# Patient Record
Sex: Male | Born: 1950
Health system: Southern US, Community
[De-identification: ages and names within clinical notes are randomized; demographics above are authoritative.]

## PROBLEM LIST (undated history)

## (undated) DIAGNOSIS — E119 Type 2 diabetes mellitus without complications: Secondary | ICD-10-CM

## (undated) DIAGNOSIS — M1990 Unspecified osteoarthritis, unspecified site: Secondary | ICD-10-CM

## (undated) DIAGNOSIS — N200 Calculus of kidney: Secondary | ICD-10-CM

## (undated) HISTORY — PX: ROTATOR CUFF REPAIR: SHX139

## (undated) HISTORY — DX: Unspecified osteoarthritis, unspecified site: M19.90

## (undated) HISTORY — DX: Calculus of kidney: N20.0

## (undated) HISTORY — PX: JOINT REPLACEMENT: SHX530

## (undated) HISTORY — DX: Type 2 diabetes mellitus without complications: E11.9

---

## 1997-12-20 ENCOUNTER — Emergency Department (HOSPITAL_COMMUNITY): Admission: EM | Admit: 1997-12-20 | Discharge: 1997-12-20 | Payer: Self-pay | Admitting: Emergency Medicine

## 1999-08-16 ENCOUNTER — Encounter: Payer: Self-pay | Admitting: Orthopedic Surgery

## 1999-08-16 ENCOUNTER — Ambulatory Visit (HOSPITAL_COMMUNITY): Admission: RE | Admit: 1999-08-16 | Discharge: 1999-08-16 | Payer: Self-pay | Admitting: Orthopedic Surgery

## 1999-11-04 ENCOUNTER — Emergency Department (HOSPITAL_COMMUNITY): Admission: EM | Admit: 1999-11-04 | Discharge: 1999-11-04 | Payer: Self-pay | Admitting: Emergency Medicine

## 2001-03-23 ENCOUNTER — Ambulatory Visit (HOSPITAL_COMMUNITY): Admission: RE | Admit: 2001-03-23 | Discharge: 2001-03-23 | Payer: Self-pay | Admitting: Gastroenterology

## 2001-05-09 ENCOUNTER — Encounter: Payer: Self-pay | Admitting: *Deleted

## 2001-05-10 ENCOUNTER — Observation Stay (HOSPITAL_COMMUNITY): Admission: EM | Admit: 2001-05-10 | Discharge: 2001-05-10 | Payer: Self-pay | Admitting: Emergency Medicine

## 2008-03-10 ENCOUNTER — Inpatient Hospital Stay (HOSPITAL_COMMUNITY): Admission: RE | Admit: 2008-03-10 | Discharge: 2008-03-15 | Payer: Self-pay | Admitting: Orthopedic Surgery

## 2010-10-26 NOTE — Op Note (Signed)
NAMEENDY, EASTERLY                ACCOUNT NO.:  [id number]   MEDICAL RECORD NO.:  [id number]          PATIENT TYPE:  INP   LOCATION:  0008                         FACILITY:  [facility]   PHYSICIAN:  Ollen Gross, M.D.    DATE OF BIRTH:  1950/10/23   DATE OF PROCEDURE:  03/10/2008  DATE OF DISCHARGE:                               OPERATIVE REPORT   PREOPERATIVE DIAGNOSIS:  Osteoarthritis bilateral knees.   POSTOPERATIVE DIAGNOSIS:  Osteoarthritis bilateral knees.   PROCEDURE:  Bilateral total knee arthroplasty.   SURGEON:  Ollen Gross, M.D.   ASSISTANT:  Alexzandrew L. Perkins, P.A.C.   ANESTHESIA:  General with postoperative epidural.   ESTIMATED BLOOD LOSS:  Minimal.   DRAIN:  Autovac x2.   TOURNIQUET TIME:  47 minutes at  300 mmHg on both sides.   COMPLICATIONS:  None.   CONDITION:  Stable to recovery room.   BRIEF CLINICAL NOTE:  Nathan Byrd is a 60 year old male with severe end-stage  arthritis of both knees, with progressively worsening pain, deformity  and dysfunction.  He has failed nonoperative management and we discussed  options of total knee arthroplasty.  Given the significant deformity and  dysfunction of both knees, and given his young age and good health, it  was decided to perform bilateral total knee arthroplasty as opposed to  staging them.  He presents now for bilateral total knee arthroplasty.   PROCEDURE IN DETAIL:  After the successful administration of an epidural  anesthetic and then general anesthetic, a tourniquet was placed high on  both thighs and both lower extremities were prepped and draped in usual  sterile fashion.  The left side hurt more preoperatively, so we did that  one first.  The left lower extremity was wrapped in Esmarch, the knee  flexed, tourniquet inflated to 300 mmHg.  A midline incision was made  with a 10 blade through subcutaneous tissue to the level of the extensor  mechanism.  A fresh blade was used to make a medial  parapatellar  arthrotomy.  Soft tissue of the proximal and medial tibia  subperiosteally elevated at the joint line with the knife and into the  semi-membranosus bursa with a Cobb elevator.  Soft tissue laterally was  elevated, with attention being paid to avoid the patellar tendon on the  tibial tubercle.  The patella was subluxed laterally, knee flexed 90  degrees and ACL and PCL removed.  A drill was used to create a starting  hole in the distal femur and the canal was thoroughly irrigated.  A 5-  degree left valgus alignment guide was placed, referencing off the  posterior condyles.  Rotation was marked and the block pinned to remove  11 mm from the distal femur.  It took an 11 because of preoperative  flexion contracture.  Distal femoral resection was made with an  oscillating saw.  A sizing block was placed and size 4 was the most  appropriate.  Rotation was marked at the epicondylar axis.  A size 4  cutting block was placed, and the anterior, posterior and chamfer cuts  were made.  The tibia was subluxed forward and the menisci removed.  Extramedullary  tibial alignment guide was placed, referencing proximally at the medial  aspect of the tibial tubercle and distally along the second metatarsal  axis and tibial crest.  The block was pinned to remove 2 mm off the more  deficient medial side.  Tibial resection was made with an oscillating  saw.  A size 4 was the most appropriate tibial component, and the  proximal tibia was prepared with the modular drill and keel punched for  a size 4.  The femoral preparation completed with the intercondylar cut.   The size 4 mobile bearing tibial trial, the size 4 posterior stabilized  femoral trial, and the 10-mm posterior stabilized rotating platform  insert trial were placed.  But, the tendon had a tiny bit of  hyperextension, so I went with the 12.5 -- which allowed for full  extension, with excellent varus, valgus, anterior and posterior  balance  throughout the full range of motion.  The patella was everted, thickness  measured to be 25 mm.  Freehand resection was taken to 13 mm, a 41  template was placed, lug holes were drilled, the trial patella was  placed and it tracked normally.  Osteophytes were removed off the  posterior femur with the trial in place.  All trials were removed and  the cut bone surfaces prepared with pulsatile lavage.  Cement was mixed,  and once ready for implantation the size 4 mobile bearing tibial tray,  the size 4 posterior stabilized femur, and the 41 patella were cemented  into place.  The patella was held with a clamp.  The trial 12.5-mm  insert was placed, the knee held in full extension, and all extruded  cement removed.  When the cement was fully hardened then the permanent  12.5-mm posterior stabilized rotating platform insert was placed into  the tibial tray.  The wounds were copiously irrigated with saline  solution and the catheter for the Autovac drain was placed.  The  arthrotomy was closed over the drain with interrupted #1 PDS.  Flexion  against gravity was 140 degrees.  The tourniquet was released to a total  time of 47 minutes, and the drain hooked to suction.  Subcutaneous was  closed with interrupted 2-0 Vicryl and subcuticular running 4-0  Monocryl.  We wrapped the knee in a moist sponge and gauze while  performing the right total knee next.   The right lower extremity was wrapped in Esmarch and the tourniquet  inflated to 300 mmHg.  The same midline incision was made with a 10  blade through the subcutaneous tissue to the level of the extensor  mechanism.  The medial parapatellar approach was made and soft tissue  release was performed.  The knee was flexed to 90 degrees.  The patella  subluxed laterally; the ACL and PCL removed.  A drill was used to create  a starting hole in the distal femur, and the canal was thoroughly  irrigated.  Then a 5-degree right valgus alignment  guide was placed, and  10 mm was taken off the distal femur because of the preoperative flexion  contracture.  A sizing block was placed, the size 4 was most  appropriate.  Rotation was marked off the epicondylar axis.  The size 4  cutting block was placed, and the anterior, posterior and chamfer cuts  were made.   The tibia was subluxed forward and the menisci removed.  Extramedullary  tibial alignment  guide was placed, referencing proximally at the medial  aspect of the tibial tubercle and distally along the second metatarsal  axis and tibial crest.  The block was pinned to remove 2 mm off the more  deficient medial side.  Tibial resection was made with an oscillating  saw.  The size 4 was the most appropriate tibial component,  and the  proximal tibia was prepared with the modular drill and keel punched for  a size 4.  Femoral preparation was completed with the intercondylar cut.   A size 4 mobile bearing tibial trial, a size 4 posterior stabilized  femoral trial, and a 12.5-mm posterior stabilized rotating platform  insert trial were placed.  Full extension was achieved, with excellent  varus, valgus anterior and posterior balance throughout the full range  of motion.  The patella was everted, this measured to be 25 mm; and with  the freehand technique the resection was taken to 13 mm, 41 template was  placed, lug holes were drilled, trial patella was placed and it tracked  normally.  Osteophytes were removed off the posterior femur with the  trial in place.  All trials were removed and the cut bone surfaces were  prepared with pulsatile lavage.  Cement was mixed, and once ready for  implantation the size 4 mobile bearing tibial tray, the size 4 posterior  stabilized femur and the 41 patella were cemented into place and the  patella was held with a clamp.  The trial 12.5-mm insert was placed, the  knee held in full extension and all extruded cement removed.  When the  cement had  fully hardened, then the permanent 12.5-mm posterior  stabilized rotating platform insert was placed into the tibial tray.  The wounds were copiously irrigated with saline solution and the  arthrotomy closed over the Autovac drain with interrupted #1 PDS.  Closure was the same as it was on the left knee.  The tourniquet had  been released with total time of 47 minutes.  The drain was hooked to  suction.  Then once closed, the incisions were cleaned and dried and  Steri-Strips and a bulky sterile dressing applied to both knees.  He was  placed into knee immobilizers, awakened and transported to recovery in  stable condition.      Ollen Gross, M.D.  Electronically Signed     FA/MEDQ  D:  03/10/2008  T:  03/11/2008  Job:  [id number]

## 2010-10-26 NOTE — H&P (Signed)
Nathan Byrd, Nathan Byrd                ACCOUNT NO.:  [id number]   MEDICAL RECORD NO.:  [id number]          PATIENT TYPE:  INP   LOCATION:  0008                         FACILITY:  [facility]   PHYSICIAN:  Ollen Gross, M.D.    DATE OF BIRTH:  1951-01-27   DATE OF ADMISSION:  03/10/2008  DATE OF DISCHARGE:                              HISTORY & PHYSICAL   CHIEF COMPLAINT:  Bilateral knee pain.   HISTORY OF PRESENT ILLNESS:  The patient 60 year old male who has seen  by Dr. Lequita Halt in second opinion earlier in the year for evaluation of  his knees.  He has been seen at the [facility] and told he had  some arthritis and has some point he would need to have surgery.  He is  at the stage now that his pain has been constant.  He has had a knee  scope in the past about 15 years ago by Dr. Cleophas Dunker.  Unfortunately,  recently and over time the knees have gotten worse.  He has been seen in  the office and found to have end-stage tricompartmental arthritis in  both knees.  He is at the point where he could benefit from undergoing  surgery.  He would like to have something done.  Dr. Lequita Halt and the  patient discussed doing both at the same time versus one at a time.  The  pros and cons of each, risks and benefits were discussed of doing both  and he would like to proceed with bilateral total knee replacements.   ALLERGIES:  No known drug allergies.   CURRENT MEDICATIONS:  Lisinopril, naproxen, Darvocet, vitamin E,  aspirin.   PAST MEDICAL HISTORY:  Hypertension, hypercholesterolemia, past history  of urinary tract infections and past history of renal calculi.   PAST SURGICAL HISTORY:  Shoulder surgery x2 and also a knee scope.   FAMILY HISTORY:  Father with history of heart failure.  Mother with  history of stroke.   SOCIAL HISTORY:  Married, truck Hospital doctor.  Chews smokeless tobacco.  Seldom intake of alcohol.  One child.  Wife will be assisting with care  after surgery.   REVIEW OF  SYSTEMS:  GENERAL:  No fevers, chills or night sweats.  NEUROLOGIC:  No seizures, syncope, or paralysis.  RESPIRATORY:  No  shortness of breath, cough or hemoptysis.  CARDIOVASCULAR:  Chest pain,  no orthopnea.  GI: No nausea, vomiting, diarrhea, or constipation.  GU:  No dysuria, hematuria, or discharge.  MUSCULOSKELETAL:  Bilateral knees.   PHYSICAL EXAMINATION:  VITAL SIGNS:  Pulse 92, respirations 14, blood  pressure 118/70  GENERAL:  A 60 year old white male well-nourished, well-developed,  barrel-chested individual, muscular frame, overweight, alert, oriented  and cooperative.  HEENT: Normocephalic, atraumatic.  Pupils round and reactive.  Oropharynx clear.  EOMs intact.  NECK:  Supple.  CHEST:  Clear, barrel-chested.  Anterior posterior chest walls are clear  to auscultation.  HEART:  Regular rate and rhythm.  Faint early systolic ejection murmur,  S1-S10.  ABDOMEN: Soft, round, slightly protuberant abdomen.  Bowel sounds  present,  RECTAL/GENITALIA:  Not done,  not pertinent to present illness.  EXTREMITIES:  Left knee range of motion 5/1 15.  No effusion, marked  crepitus noted.  Range of motion on the right knee is 5/1 15 also no  effusion, marked crepitus noted.   IMPRESSION:  Bilateral knees osteoarthritis.   PLAN:  The patient will be admitted to [facility] to undergo  bilateral total knee replacement arthroplasty.  Surgery will be  performed by Dr. Ollen Gross.      Alexzandrew L. Perkins, P.A.C.      Ollen Gross, M.D.  Electronically Signed    ALP/MEDQ  D:  03/09/2008  T:  03/10/2008  Job:  [id number]

## 2010-10-29 NOTE — H&P (Signed)
[place]. [facility]  Patient:    Nathan Byrd, Nathan Byrd Visit Number: [id number] MRN: [id number]          Service Type: EMS Location: [facility] Attending Physician:  Nelia Shi Dictated by:   Nathen May, M.D., [facility] Admit Date:  05/09/2001   CC:         [name], M.D.   History and Physical  REASON FOR ADMISSION: The patient is seen at the request of the emergency room in consultation for left arm pain.  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 This has not been associated with any chest discomfort. It is associated with some diaphoresis on occasion and has been aggravated by use and reuse. 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  The patient sought out medical attention earlier this week at which time the hypertension and hyperlipidemia was noted. The discomfort was not felt to be cardiac.  The patient presents to the emergency room tonight because of the persistence of this arm discomfort all day long today, though it was in a waxing and waning fashion.  He also had 6-8 hours of the discomfort yesterday. Upon arrival to the emergency room he was given nitroglycerin and subsequently IV nitroglycerin with resolution of his discomfort.  The patients risk factors are as noted above. The patient is markedly limited in his functional capacity, while he is able to lift his aerobic tolerance support is quite poor. He is dyspneic on a flight of stairs in less than 200 feet of ambulating. He does not have nocturnal  dyspnea, orthopnea, or pedal edema. He denies unilateral leg swelling. He denies peripheral claudications and no palpitations and no syncope.  FAMILY HISTORY: As noted above.  PAST SURGICAL HISTORY: Notable for a rotator cuff operation and knee operation.  SOCIAL HISTORY: He is married. He uses smokeless tobacco. He does not use alcohol. He has one daughter.  MEDICATIONS: Include an unknown antihypertensive which begins with the letter T.  ALLERGIES: He has no known drug allergies.  PHYSICAL EXAMINATION:  GENERAL: On examination, he is a obese, middle-aged, Caucasian male in no acute distress.  VITAL SIGNS: His blood pressure is 117/60, his pulse is 94.  HEENT: Examination demonstrated no ______ examination. His neck veins were flat. His carotids were brisk and full bilaterally without bruits and there is no lymphadenopathy.  BACK: Without kyphosis or scoliosis.  LUNGS: Clear.  HEART: His heart sounds were regular and distant. There was no significant murmurs or gallops.  ABDOMEN: Quite protuberant. The liver was 1 cm below the costal margin. There is no midline pulsation appreciated.  PULSES: Femoral pulses were 2+ distal. Pulses were intact.  EXTREMITIES: There is no clubbing, cyanosis, or edema.  NEUROLOGICAL: There was reproducible tenderness in his left upper extremity with manipulations in his arm, particularly with shoulder adduction and tenderness along the bicipital tendon.  SKIN: Warm and dry.  LABORATORY AND ACCESSORY DATA: The electrocardiogram had no acute changes.  IMPRESSION: 1. Arm pain, atypical for cardiac not with any risk factors based on:    a. Prolonged ratio with negative  enzymes.    b. Aggravated with lifting.    c. Some reproduction with bicipital tendon and compression. 2. Multiple cardiac risk factors including:    a. Smokeless tobacco.    b. Hypertension.    c. Hyperlipidemia.    d. Family history. 3. Obesity. 4. Functional  capacity is quite impaired.  DISCUSSION: The patient has arm pain which I do not think is cardiac. However, he has multiple cardiac risk factors and I think overnight observation in the chest pain unit with early ______discharge Cardiolite screening is appropriate.  We will start him on aspirin therapy as a nonsteroidal of choice. Continue him on nitroglycerin as it was started in the emergency room and follow serial enzymes. Dictated by:   Nathen May, M.D., [facility] Attending Physician:  Nelia Shi DD:  05/10/01 TD:  05/10/01 Job: [id number]

## 2010-10-29 NOTE — Procedures (Signed)
[name]. [facility]  Patient:    Nathan Byrd, Nathan Byrd Visit Number: [id number] MRN: [id number]          Service Type: END Location: ENDO Attending Physician:  Nelda Marseille Dictated by:   Petra Kuba, M.D. Proc. Date: 03/23/01 Admit Date:  03/23/2001   CC:         [name]. Earlene Plater, M.D.   Procedure Report  PROCEDURES PERFORMED:  Colonoscopy.  INDICATIONS:  Guaiac positivity in a patient due for colonic screening. Consent was signed after risks, benefits, methods and options were thoroughly discussed in the office with the patient and his wife.  MEDICINES USED:  Demerol 70 mg, Versed 7 mg.  DESCRIPTION OF PROCEDURE:  Rectal inspection is pertinent for external hemorrhoids, small. Digital exam was negative. The video colonoscope was inserted and easily advanced around the colon to the cecum. This did not require any abdominal pressure or any position changes. The cecum was identified by the appendiceal orifice and the ileocecal valve, in fact, the scope was inserted a short ways into the terminal ileum which was normal. Photodocumentation was obtained. No blood was seen and no obvious abnormality was seen on insertion. The scope was slowly withdrawn. On slow withdrawal the prep was adequate. There was some liquid stool that required washing and suctioning, but on slow withdrawal through the colon, no abnormalities were seen, specifically no polyps, diverticula, masses or other abnormalities. Once back in the rectum was retroflexed, pertinent for some internal hemorrhoids. The scope was straightened and readvanced a short ways up the left side of the colon. Air was suctioned and the scope removed. The patient tolerated the procedure well. There was no obvious immediate complication.  ENDOSCOPIC DIAGNOSES: 1. Internal and external small hemorrhoids. 2. Otherwise negative examination to the terminal ileum.  PLAN: 1. Follow-up p.r.n. or in two  months to recheck quaiac and    symptoms, possibly CBC and make sure no further workup plans    are needed. 2. Will probably repeat screening in 5-10 years unless needed    sooner p.r.n. Dictated by:   Petra Kuba, M.D. Attending Physician:  Nelda Marseille DD:  03/23/01 TD:  03/24/01 Job: [id number]

## 2010-10-29 NOTE — Discharge Summary (Signed)
[place]. [facility]  Patient:    Nathan Byrd, Nathan Byrd Visit Number: [id number] MRN: [id number]          Service Type: MED Location: 2000 2037 01 Attending Physician:  Nathen May Dictated by:   Dian Queen, P.A.C. [facility] Admit Date:  05/09/2001 Discharge Date: 05/10/2001   CC:         [name], M.D., [address] [place], [place]   Referring Physician Discharge [place]  HISTORY OF PRESENT ILLNESS:  Mr. Nathan Byrd is a 60 year old, white, married male with hypertension, a history of hyperlipidemia, and a family history of coronary artery disease, who was admitted with atypical chest pain that to Nathen May, M.D., F.A.C.C., sounded atypical, probably musculoskeletal.  It involved mostly his left arm.  HOSPITAL COURSE:  He was watched overnight.  Serial enzymes were negative for myocardial necrosis.  He had no further symptoms.  The following morning, he was pain-free.  There were no serial evolutionary EKG changes.  FINAL DIAGNOSIS: 1. Atypical arm pain in a man with multiple cardiac risk factors, infarct    ruled out. 2. Treated hypertension. 3. History of mild hyperlipoproteinemia. 4. Family history of coronary artery disease. 5. Uses smokeless tobacco.  DISPOSITION:  We will let him go home today.  ACTIVITY:  He is to refrain from anything strenuous.  DISCHARGE MEDICATIONS:  He is to take two aspirin q.6h. until tomorrow, at which time we have arranged an outpatient rest/stress Cardiolite to be done. We will also give him Imdur 30 mg to take today and tomorrow pending the results of the Cardiolite. Dictated by:   Dian Queen, P.A.C. [facility] Attending Physician:  Nathen May DD:  05/10/01 TD:  05/10/01 Job: [id number]

## 2010-10-29 NOTE — Discharge Summary (Signed)
NAMERIK, WADEL                ACCOUNT NO.:  [id number]   MEDICAL RECORD NO.:  [id number]          PATIENT TYPE:  INP   LOCATION:  1607                         FACILITY:  [facility]   PHYSICIAN:  Ollen Gross, M.D.    DATE OF BIRTH:  October 06, 1950   DATE OF ADMISSION:  03/10/2008  DATE OF DISCHARGE:  03/15/2008                               DISCHARGE SUMMARY   ADMITTING DIAGNOSES:  1. Bilateral knee osteoarthritis.  2. Hypertension.  3. Hypercholesterolemia.  4. Past history of urinary tract infection.  5. Past history of renal calculi.   DISCHARGE DIAGNOSES:  1. Osteoarthritis bilateral knees status post bilateral total knee      replacement arthroplasties.  2. Acute postoperative blood loss anemia.  3. Status post transfusion without sequelae.  4. Postoperative hyponatremia.  5. Postoperative hypokalemia.  6. Hypertension.  7. Hypercholesterolemia.  8. Past history of urinary tract infection.  9. Past history of renal calculi.   PROCEDURE:  March 10, 2008, bilateral total knee arthroplasty.  Surgeon, Dr. Lequita Halt.  Assistant, Avel Peace, P.A.-C.  Anesthesia with  general, postoperative epidural placed.   CONSULTS:  None.   BRIEF HISTORY:  Nathan Byrd is a 60 year old male with severe end-stage  arthritis both knees, progressive worsening pain and dysfunction, who  failed nonoperative management.  Now discussed options for total knee  arthroplasties.  Would like to proceed with bilateral total knees.   LABORATORY DATA:  Preoperative CBC showed hemoglobin 13.9, hematocrit of  41.5, white cell count 8.5, red cell count 4.48, platelets 304.  Postoperative hemoglobin 11.5, drifted down to 9.4, got as low as 8.2;  given 2 units of blood; post procedure hemoglobin back up to 9.4 with a  hematocrit of 28.  PT/PTT preoperative 12.6 and 28, respectively.  INR  0.9.  Serial pro times followed per Coumadin protocol; last noted PT/INR  24.2 and 2.  Chem panel on admission did show  slight elevated glucose of  128 and total bilirubin of 1.4.  Remaining Chem panel within normal  limits.  Serial BMETs were followed.  Sodium did drop from 139-133, back  up to 135; potassium started out at 3.6, went up to 4.2, back down to  3.4, last noted just slightly below normal at 3.2.  Preop UA negative.  Blood group type A negative.   EKG February 19, 2008:  Normal sinus rhythm, baseline, no acute  findings.   Chest x-ray March 04, 2008:  Prominent caliber of the main pulmonary  segment, the etiology which was uncertain, exam otherwise negative.   HOSPITAL COURSE:  The patient admitted to the [facility],  tolerated the procedure well, later transferred to the recovery room and  orthopedic floor, started on PCA, and postoperative epidural placed by  anesthesia for postoperative pain control.  Was actually doing very well  on the morning of day 1 due to epidural control.  Blood pressure was  stable.  Started back on his home medicines with exception of the  lisinopril that was held postoperatively.  Output was excellent.  Started getting up out of bed more than a few  feet on that first day.  Started Coumadin that night; he was placed on Coumadin for DVT  prophylaxis.  Epidural remained in until postoperative day #2, removed  by anesthesia.  Once the epidural was out, we left the Foley in for  additional 6 hours and started Lovenox 6 hours after being pulled;  Lovenox coverage until Coumadin was therapeutic.  Hemoglobin was down to  9.4.  Both incisions checked on day 2; both were healing well.  By day  3, the patient was getting better.  Hemoglobin was down to 8.5, was  asymptomatic with this.  The patient just wanted to watch it.  We left  his PCA in another day just for pain control once the epidural was out.  Blood pressures were a little on the lower side but running pretty well  the past 24 hours.  INR was slowly increasing.  Is doing well with  therapy,  walking about 70 feet, and later got up to 170 feet by  postoperative day 4 and postoperative day 5.  It was noted by March 14, 2008, the patient's hemoglobin was down to 8.2.  It was felt due to the  low hemoglobin that he would benefit from undergoing transfusion on  postoperative day 4.  Blood was transfused.  Potassium was low, so we  put him potassium supplements also.  By postoperative day 5, his  hemoglobin was stable.  He was walking 150 feet, tolerating his  medications.  He was ready to go home.   DISCHARGE PLAN:  1. The patient was discharged home on April 15, 2008.  2. Discharge diagnoses:  Please see above.  3. Discharge medications:  Percocet, Robaxin, Nu-Iron, Coumadin.  4. Diet:  Heart-healthy diet.  5. Activity:  Weightbearing as tolerated, both legs, total knee      protocol.  Home health PT, home health. Nursing.  6. Follow-up 2 weeks.   DISPOSITION:  Home.   CONDITION ON DISCHARGE:  Improved.      Alexzandrew L. Perkins, P.A.C.      Ollen Gross, M.D.  Electronically Signed    ALP/MEDQ  D:  04/15/2008  T:  04/15/2008  Job:  [id number]   cc:   Ollen Gross, M.D.  Fax: [phone]

## 2011-03-14 LAB — CROSSMATCH: ABO/RH(D): A NEG

## 2011-03-14 LAB — BASIC METABOLIC PANEL
BUN: 12
BUN: 4 — ABNORMAL LOW
BUN: 4 — ABNORMAL LOW
BUN: 6
CO2: 28
CO2: 30
CO2: 31
CO2: 35 — ABNORMAL HIGH
Calcium: 7.8 — ABNORMAL LOW
Calcium: 7.8 — ABNORMAL LOW
Chloride: 100
Chloride: 100
Chloride: 97
Chloride: 98
Creatinine, Ser: 0.8
GFR calc non Af Amer: >60
Glucose, Bld: 166 — ABNORMAL HIGH
Glucose, Bld: 159 — ABNORMAL HIGH
Glucose, Bld: 170 — ABNORMAL HIGH
Glucose, Bld: 183 — ABNORMAL HIGH
Potassium: 4
Potassium: 4.2
Potassium: 3.2 — ABNORMAL LOW
Potassium: 3.5
Sodium: 135
Sodium: 135

## 2011-03-14 LAB — CBC
HCT: 27.8 — ABNORMAL LOW
HCT: 33.6 — ABNORMAL LOW
HCT: 41.5
HCT: 24.2 — ABNORMAL LOW
HCT: 25.4 — ABNORMAL LOW
HCT: 28 — ABNORMAL LOW
Hemoglobin: 11.5 — ABNORMAL LOW
Hemoglobin: 13.9
Hemoglobin: 9.4 — ABNORMAL LOW
Hemoglobin: 8.2 — ABNORMAL LOW
Hemoglobin: 9.4 — ABNORMAL LOW
MCHC: 33.5
MCHC: 33.9
MCHC: 33.6
MCHC: 33.7
MCHC: 34.1
MCV: 92.4
MCV: 92.7
MCV: 93.6
MCV: 92.6
MCV: 93.4
Platelets: 232
Platelets: 304
Platelets: 175
Platelets: 224
Platelets: 287
RBC: 4.48
RDW: 12.8
RDW: 13.1
RDW: 12.7
RDW: 13.3
WBC: 10.3
WBC: 8.5

## 2011-03-14 LAB — COMPREHENSIVE METABOLIC PANEL
ALT: 26
AST: 25
Albumin: 3.8
Alkaline Phosphatase: 74
BUN: 10
CO2: 27
Calcium: 9.6
Chloride: 103
Creatinine, Ser: 0.88
GFR calc Af Amer: >60
GFR calc non Af Amer: >60
Glucose, Bld: 128 — ABNORMAL HIGH
Potassium: 3.6
Sodium: 139
Total Bilirubin: 1.4 — ABNORMAL HIGH
Total Protein: 6.7

## 2011-03-14 LAB — APTT: aPTT: 28

## 2011-03-14 LAB — PROTIME-INR
INR: 0.9
Prothrombin Time: 12.6
Prothrombin Time: 17.3 — ABNORMAL HIGH

## 2011-03-14 LAB — URINALYSIS, ROUTINE W REFLEX MICROSCOPIC
Ketones, ur: NEGATIVE
Nitrite: NEGATIVE
Protein, ur: NEGATIVE

## 2011-03-14 LAB — ABO/RH: ABO/RH(D): A NEG

## 2011-05-20 ENCOUNTER — Encounter (INDEPENDENT_AMBULATORY_CARE_PROVIDER_SITE_OTHER): Payer: 59

## 2011-05-20 DIAGNOSIS — E669 Obesity, unspecified: Secondary | ICD-10-CM

## 2011-05-20 DIAGNOSIS — Z23 Encounter for immunization: Secondary | ICD-10-CM

## 2011-05-20 DIAGNOSIS — Z0000 Encounter for general adult medical examination without abnormal findings: Secondary | ICD-10-CM

## 2012-05-13 ENCOUNTER — Ambulatory Visit (INDEPENDENT_AMBULATORY_CARE_PROVIDER_SITE_OTHER): Payer: 59 | Admitting: Family Medicine

## 2012-05-13 VITALS — BP 129/83 | HR 85 | Temp 98.5°F | Resp 17 | Ht 67.5 in | Wt 234.0 lb

## 2012-05-13 DIAGNOSIS — Z0000 Encounter for general adult medical examination without abnormal findings: Secondary | ICD-10-CM

## 2012-05-13 NOTE — Progress Notes (Signed)
@[email or web address] @  Patient ID: Nathan Byrd MRN: [id number], DOB: 05-11-1951 61 y.o. Date of Encounter: 05/13/2012, 8:49 AM  Primary Physician: No primary provider on file.  Chief Complaint: Physical (CPE)  HPI: 61 y.o. y/o male with history noted below here for CPE.  Doing well. No issues/complaints. 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 He had high blood pressure in the past but has recently been taken off his medicine because his pressure was low. Currently uses taking magnesium. He has to wear glasses for driving.  Review of Systems: Consitutional: No fever, chills, fatigue, night sweats, lymphadenopathy, or weight changes. Eyes: No visual changes, eye redness, or discharge. ENT/Mouth: Ears: No otalgia, tinnitus, hearing loss, discharge. Nose: No congestion, rhinorrhea, sinus pain, or epistaxis. Throat: No sore throat, post nasal drip, or teeth pain. Cardiovascular: No CP, palpitations, diaphoresis, DOE, edema, orthopnea, PND. Respiratory: No cough, hemoptysis, SOB, or wheezing. Gastrointestinal: No anorexia, dysphagia, reflux, pain, nausea, vomiting, hematemesis, diarrhea, constipation, BRBPR, or melena. Genitourinary: No dysuria, frequency, urgency, hematuria, incontinence, nocturia, decreased urinary stream, discharge, impotence, or testicular pain/masses. Musculoskeletal: No decreased ROM, myalgias, stiffness, joint swelling, or weakness. Skin: No rash, erythema, lesion changes, pain, warmth, jaundice, or pruritis. Neurological: No headache, dizziness, syncope, seizures, tremors, memory loss, coordination problems, or paresthesias. Psychological: No anxiety, depression, hallucinations, SI/HI. Endocrine: No fatigue, polydipsia, polyphagia, polyuria, or known diabetes. All other systems were reviewed and are otherwise negative.  Past Medical History  Diagnosis Date  . Diabetes mellitus without complication      Past Surgical History    Procedure Date  . Joint replacement     Home Meds:  Prior to Admission medications   Medication Sig Start Date End Date Taking? Authorizing Provider  magnesium 30 MG tablet Take 30 mg by mouth 2 (two) times daily.   Yes Historical Provider, MD    Allergies: No Known Allergies  History   Social History  . Marital Status: Married    Spouse Name: N/A    Number of Children: N/A  . Years of Education: N/A   Occupational History  . Not on file.   Social History Main Topics  . Smoking status: Never Smoker   . Smokeless tobacco: Not on file  . Alcohol Use: No  . Drug Use: No  . Sexually Active: Not on file   Other Topics Concern  . Not on file   Social History Narrative  . No narrative on file    History reviewed. No pertinent family history.  Physical Exam: Blood pressure 129/83, pulse 85, temperature 98.5 F (36.9 C), temperature source Oral, resp. rate 17, height 5' 7.5" (1.715 m), weight 234 lb (106.142 kg), SpO2 97.00%.  General: Well developed, well nourished, in no acute distress. HEENT: Normocephalic, atraumatic. Conjunctiva pink, sclera non-icteric. Pupils 2 mm constricting to 1 mm, round, regular, and equally reactive to light and accomodation. EOMI. Internal auditory canal clear. TMs with good cone of light and without pathology. Nasal mucosa pink. Nares are without discharge. No sinus tenderness. Oral mucosa pink. Dentition normal. Pharynx without exudate.   Neck: Supple. Trachea midline. No thyromegaly. Full ROM. No lymphadenopathy. Lungs: Clear to auscultation bilaterally without wheezes, rales, or rhonchi. Breathing is of normal effort and unlabored. Cardiovascular: RRR with S1 S2. No murmurs, rubs, or gallops appreciated. Distal pulses 2+ symmetrically. No carotid or abdominal bruits Abdomen: Soft, non-tender, non-distended with normoactive bowel sounds. No hepatosplenomegaly or masses. No rebound/guarding. No CVA tenderness. Without hernias.  Genitourinary:  circumcised male. No penile lesions. Testes descended bilaterally, and smooth without tenderness or masses.  Musculoskeletal: Full range of motion and 5/5 strength throughout. Without swelling, atrophy, tenderness, crepitus, or warmth. Extremities without clubbing, cyanosis, or edema. Calves supple. Skin: Warm and moist without erythema, ecchymosis, wounds, or rash. Neuro: A+Ox3. CN II-XII grossly intact. Moves all extremities spontaneously. Full sensation throughout. Normal gait. DTR 2+ throughout upper and lower extremities. Finger to nose intact. Psych:  Responds to questions appropriately with a normal affect.   Studies:  Cholesterol 223, LDL cholesterol 153, PSA 2.17, hemoglobin A1c 6.0   Assessment/Plan:  61 y.o. y/o  male here for CPE -  Signed, Elvina Sidle, MD 05/13/2012 8:49 AM

## 2013-05-05 ENCOUNTER — Ambulatory Visit (INDEPENDENT_AMBULATORY_CARE_PROVIDER_SITE_OTHER): Payer: 59 | Admitting: Family Medicine

## 2013-05-05 VITALS — BP 118/78 | HR 80 | Temp 98.3°F | Resp 17 | Ht 66.5 in | Wt 250.0 lb

## 2013-05-05 DIAGNOSIS — E669 Obesity, unspecified: Secondary | ICD-10-CM

## 2013-05-05 DIAGNOSIS — Z0000 Encounter for general adult medical examination without abnormal findings: Secondary | ICD-10-CM

## 2013-05-05 LAB — POCT URINALYSIS DIPSTICK
Bilirubin, UA: NEGATIVE
Glucose, UA: NEGATIVE
Nitrite, UA: NEGATIVE
Spec Grav, UA: 1.02
Urobilinogen, UA: 0.2

## 2013-05-05 LAB — TSH: TSH: 1.323 u[IU]/mL (ref 0.350–4.500)

## 2013-05-05 LAB — CBC
HCT: 43.6 % (ref 39.0–52.0)
MCH: 29.9 pg (ref 26.0–34.0)
MCHC: 34.2 g/dL (ref 30.0–36.0)
MCV: 87.6 fL (ref 78.0–100.0)
Platelets: 285 10*3/uL (ref 150–400)
RDW: 13.4 % (ref 11.5–15.5)
WBC: 7.9 10*3/uL (ref 4.0–10.5)

## 2013-05-05 LAB — LIPID PANEL
Cholesterol: 229 mg/dL — ABNORMAL HIGH (ref 0–200)
Triglycerides: 137 mg/dL (ref <150)

## 2013-05-05 LAB — PSA: PSA: 1.66 ng/mL (ref <=4.00)

## 2013-05-05 LAB — POCT UA - MICROSCOPIC ONLY
Bacteria, U Microscopic: NEGATIVE
Casts, Ur, LPF, POC: NEGATIVE
Yeast, UA: NEGATIVE

## 2013-05-05 LAB — COMPREHENSIVE METABOLIC PANEL
BUN: 16 mg/dL (ref 6–23)
CO2: 28 mEq/L (ref 19–32)
Glucose, Bld: 104 mg/dL — ABNORMAL HIGH (ref 70–99)
Sodium: 140 mEq/L (ref 135–145)
Total Bilirubin: 1.6 mg/dL — ABNORMAL HIGH (ref 0.3–1.2)
Total Protein: 6.8 g/dL (ref 6.0–8.3)

## 2013-05-05 NOTE — Patient Instructions (Signed)
1.  [facility]IRM WITH THE [facility] IF YOU HAVE RECEIVED YOUR SHINGLES VACCINE (ZOSTAVAX).

## 2013-05-05 NOTE — Progress Notes (Signed)
Subjective:  This chart was scribed for Nathan Simmer, MD by Nathan Byrd, Medical Scribe. This patient was seen in Room 10 and the patient's care was started at 8:38 AM.   Patient ID: Nathan Byrd, male    DOB: 23-Feb-1951, 62 y.o.   MRN: [id number]  HPI HPI Comments: Nathan Byrd is a 62 y.o. male with chronic back pain who presents to the [facility] needing an employment physical examination,  The patient was last at [facility] on May 13, 2012 and states that he has to have an employment physical examination yearly.  The patient's last physical examination was a year ago.  He states that he used to be on blood pressure medication but no longer uses it.  He is unsure as to when his last tetanus shot was; [facility] administered 2012.  He states that he received his flu shot in October at work.  The patient states that he went to the [place] and is unsure as to whether he was given the shingles vaccination.  He states that his blood work is done at the [place].  The patient's last colonoscopy was 2-3 years ago and was done at the [place] and was negative for polyps.  He was told to return to the [place] for his next colonscopy in 10 years.  He states that his last eye exam was last year and was negative for glaucoma or any other eye problems. He states that his last dental exam was 6 months ago and was normal.    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  The patient states that he wears his seatbelt regularly.  The patient states that he has guns in the home that are loaded and unsecured.    The patient is unsure as to weather he has arthritis in his back. He denies problems getting up in the morning, shoulder pain, headaches, mouth sores, SOB, cough, nausea, emesis, diarrhea, constipation, polydipsia, polyphagia, polyuria, dysuria, hematuria, decreased urine volume, penile swelling, urgency, arthralgias, neck stiffness, numbness or tingling in his legs, abnormal heart  rates or rhythms, headaches, sleep disturbances, behavioral problems, and neck pain as associated symptoms.  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  He states that he will experience bilateral feet swelling after driving for long periods of time.  He states that he snores but denies having a sleep study performed.  He states that he will occasionally experience heartburn after eating certain meals.  He states that he will use the bathroom up to 3 times a night with no decreased intensity in his stream.  He states that he will experience occasional blurred vision.  He states that he will experience occasional hematochezia.  He states that he will experience occasional road rage but states that it does not interfere with his job.  He denies a history of hemorrhoids.  The patient states that he had rotator cuff replacement and knee joint replacement surgery at the [place].  He denies having a history of any other surgeries.   He denies experiencing any episodes of MI or TIA.    The patient states that he has been married for 43 years.  He states that he has 1 child and 3 grandchildren.  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  He states that he plans on working for a couple more  years.  The patient states that he consumed chewing tobacco for 30 years but states that he no longer uses it.  He states that he drinks a 6 pack of beer while at the beach but otherwise does not drink regularly.    He states that he takes 81 mg of baby aspirin and 30 mg magnesium tablets daily.    He states that his mother died at 3 and was diagnosed with Alzheimer's.  He states that his father died  MI at 64 from MI.  He states that his father's first MI occurred when he was 55.  The patient states that he has a older brother and two younger sisters with no major health problems.  The patient denies having a family history of colon cancer and  prostate cancer.   The patient states that he would like to get his blood work done at [facility].     Review of Systems  HENT: Negative for dental problem, hearing loss and mouth sores.   Eyes: Negative for visual disturbance.  Respiratory: Negative for cough and shortness of breath.   Cardiovascular: Negative for chest pain.  Gastrointestinal: Negative for nausea, vomiting, diarrhea and constipation.  Endocrine: Negative for polydipsia, polyphagia and polyuria.  Genitourinary: Negative for dysuria, urgency, hematuria, decreased urine volume, penile swelling, scrotal swelling, enuresis, difficulty urinating, penile pain and testicular pain.  Musculoskeletal: Positive for back pain. Negative for arthralgias, neck pain and neck stiffness.  Neurological: Negative for numbness and headaches.  Psychiatric/Behavioral: Negative for suicidal ideas, hallucinations, behavioral problems, sleep disturbance, dysphoric mood, decreased concentration and agitation. The patient is not nervous/anxious.   All other systems reviewed and are negative.   Past Medical History  Diagnosis Date  . Diabetes mellitus without complication   . Arthritis     s/p B TKR   Past Surgical History  Procedure Laterality Date  . Joint replacement      s/p B TKR  . Rotator cuff repair      Bilateral.   No Known Allergies Current Outpatient Prescriptions on File Prior to Visit  Medication Sig Dispense Refill  . magnesium 30 MG tablet Take 30 mg by mouth 2 (two) times daily.       No current facility-administered medications on file prior to visit.   History   Social History  . Marital Status: Married    Spouse Name: N/A    Number of Children: 1  . Years of Education: N/A   Occupational History  . Truck driver     drives locally   Social History Main Topics  . Smoking status: Never Smoker   . Smokeless tobacco: Former Neurosurgeon    Types: Chew  . Alcohol Use: 3.6 oz/week    6 Cans of beer per week  . Drug Use:  No  . Sexual Activity: Yes   Other Topics Concern  . Not on file   Social History Narrative   Marital status:  Married x 43 years; happily      Children:  1 child; 3 grandchildren.      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  Plans to retire age 89.        Tobacco abuse: chewed for 30 years; quit.      Alcohol:  6 pack per week      Drugs: none      Exercise: none; job very physical.      Seatbelt: 100%      Guns: loaded and unsecured.  Family History  Problem Relation Age of Onset  . Alzheimer's disease Mother   . Heart disease Father 77    AMI     Objective:  Physical Exam  Nursing note and vitals reviewed. Constitutional: He is oriented to person, place, and time. He appears well-developed and well-nourished. No distress.  HENT:  Head: Normocephalic and atraumatic.  Right Ear: External ear normal.  Left Ear: External ear normal.  Nose: Nose normal.  Mouth/Throat: Oropharynx is clear and moist. No oropharyngeal exudate.  Eyes: Conjunctivae and EOM are normal. Pupils are equal, round, and reactive to light.  Neck: Normal range of motion. Neck supple. No spinous process tenderness present. No thyromegaly present.  Cardiovascular: Normal rate, regular rhythm and normal heart sounds.  Exam reveals no gallop and no friction rub.   No murmur heard. Frequent PVCs.  Pulmonary/Chest: Effort normal and breath sounds normal. No respiratory distress. He has no wheezes. He has no rales.  Abdominal: Hernia confirmed negative in the right inguinal area and confirmed negative in the left inguinal area.  Genitourinary: Rectum normal, testes normal and penis normal. Prostate is enlarged (slightly). No penile tenderness.  Musculoskeletal: Normal range of motion. He exhibits no edema.       Right shoulder: Normal.       Left shoulder: Normal.       Cervical back: Normal.       Lumbar back: Normal.  Lymphadenopathy:    He has no cervical adenopathy.       Right:  No inguinal adenopathy present.       Left: No inguinal adenopathy present.  Neurological: He is alert and oriented to person, place, and time. He displays a negative Romberg sign.  Skin: Skin is warm and dry.  Psychiatric: He has a normal mood and affect. His behavior is normal.    Results for orders placed in visit on 05/05/13  POCT UA - MICROSCOPIC ONLY      Result Value Range   WBC, Ur, HPF, POC 0-2     RBC, urine, microscopic neg     Bacteria, U Microscopic neg     Mucus, UA neg     Epithelial cells, urine per micros neg     Crystals, Ur, HPF, POC neg     Casts, Ur, LPF, POC neg     Yeast, UA neg    POCT URINALYSIS DIPSTICK      Result Value Range   Color, UA yellow     Clarity, UA clear     Glucose, UA neg     Bilirubin, UA neg     Ketones, UA neg     Spec Grav, UA 1.020     Blood, UA neg     pH, UA 5.5     Protein, UA neg     Urobilinogen, UA 0.2     Nitrite, UA neg     Leukocytes, UA Negative     EKG: NSR; no ST changes; +PVCs.  Assessment & Plan:   1. Routine general medical examination at a health care facility   2. Overweight and obesity(278.0)    1.  CPE:  Anticipatory guidance --- weight loss, exercise.  Colonoscopy UTD; immunizations UTD; to confirm date of Zostavax and that was previously administered.  Obtain labs.   2. Obesity: worsening; recommend weight loss, exercise.    Meds ordered this encounter  Medications  . aspirin 81 MG tablet    Sig: Take 81 mg by mouth daily.  I personally performed the services described in this documentation, which was scribed in my presence.  The recorded information has been reviewed and is accurate.

## 2014-04-30 ENCOUNTER — Ambulatory Visit (INDEPENDENT_AMBULATORY_CARE_PROVIDER_SITE_OTHER): Payer: Self-pay | Admitting: Emergency Medicine

## 2014-04-30 VITALS — BP 136/84 | HR 90 | Temp 97.8°F | Resp 16 | Ht 67.0 in | Wt 256.2 lb

## 2014-04-30 DIAGNOSIS — Z024 Encounter for examination for driving license: Secondary | ICD-10-CM

## 2014-04-30 DIAGNOSIS — Z029 Encounter for administrative examinations, unspecified: Secondary | ICD-10-CM

## 2014-04-30 LAB — GLUCOSE, POCT (MANUAL RESULT ENTRY): POC GLUCOSE: 103 mg/dL — AB (ref 70–99)

## 2014-04-30 NOTE — Progress Notes (Signed)
   Subjective:  This chart was scribed for Darlyne Russian, MD by Ladene Artist, ED Scribe. The patient was seen in room 1. Patient's care was started at 9:51 AM.    Patient ID: Nathan Byrd, male    DOB: [birth year], 63 y.o.   MRN: [id number]   Chief Complaint  Patient presents with  . Annual Exam    DOT PE   HPI HPI Comments: Nathan Byrd is a 63 y.o. male, with a h/o DM, arthritis and nephrolithiasis, who presents to the [facility] for an annual exam and DOT exam. Pt was seen at [facility] in [place] 2 weeks ago and had blood work done which he plans to pick up today. Pt reports h/o low magnesium. He also reports h/o HTN that has resolved since weight loss. Pt denies h/o sleep apnea. Pt does not report any concerns at this time.   Past Medical History  Diagnosis Date  . Diabetes mellitus without complication   . Arthritis     s/p B TKR  . Nephrolithiasis    Current Outpatient Prescriptions on File Prior to Visit  Medication Sig Dispense Refill  . aspirin 81 MG tablet Take 81 mg by mouth daily.    . magnesium 30 MG tablet Take 30 mg by mouth 2 (two) times daily.     No current facility-administered medications on file prior to visit.   Allergies  Allergen Reactions  . Ace Inhibitors Swelling    angioedema  . Bee Venom   . Penicillins    Review of Systems  Constitutional: Negative for fever and chills.  Eyes: Negative for visual disturbance.  Respiratory: Negative for chest tightness and shortness of breath.   Cardiovascular: Negative for chest pain.  Neurological: Negative for dizziness, syncope and light-headedness.  Psychiatric/Behavioral: Negative for sleep disturbance.      Objective:   Physical Exam CONSTITUTIONAL: Well developed/well nourished HEAD: Normocephalic/atraumatic EYES: EOMI/PERRL ENMT: Mucous membranes moist NECK: supple no meningeal signs SPINE/BACK:entire spine nontender CV: S1/S2 noted, no murmurs/rubs/gallops noted LUNGS: Lungs  are clear to auscultation bilaterally, no apparent distress ABDOMEN: soft, nontender, no rebound or guarding, bowel sounds noted throughout abdomen GU:no cva tenderness NEURO: Pt is awake/alert/appropriate, moves all extremitiesx4. No facial droop.   EXTREMITIES: pulses normal/equal, full ROM SKIN: warm, color normal PSYCH: no abnormalities of mood noted, alert and oriented to situation    Results for orders placed or performed in visit on 04/30/14  POCT glucose (manual entry)  Result Value Ref Range   POC Glucose 103 (A) 70 - 99 mg/dl   for 2 years Assessment & Plan:   We'll check a fasting glucose today he does qualify for DOT card for 2 years I personally performed the services described in this documentation, which was scribed in my presence. The recorded information has been reviewed and is accurate.

## 2014-05-01 ENCOUNTER — Telehealth: Payer: Self-pay

## 2014-05-01 NOTE — Telephone Encounter (Signed)
LM for pt on cell. Do not have records from [place] as of today.

## 2014-05-01 NOTE — Telephone Encounter (Signed)
Pt saw Dr. Everlene Farrier on 11/18 for a Dot. Pt wanted to confirm that Dr. Everlene Farrier has received necessary paperwork from the [facility]

## 2014-05-02 ENCOUNTER — Telehealth: Payer: Self-pay

## 2014-05-02 NOTE — Telephone Encounter (Signed)
Patient came by 102 to drop off [facility] forms with lab results to have his other pe form complete. I placed it in sara's box.

## 2014-05-02 NOTE — Telephone Encounter (Signed)
Placed paperwork in Dr. Perfecto Kingdom box

## 2014-05-06 ENCOUNTER — Telehealth: Payer: Self-pay

## 2014-05-06 NOTE — Telephone Encounter (Signed)
Pt wife is going to bring back in the biometric form pt needs to have filled out.  Could not find the form.

## 2014-05-06 NOTE — Telephone Encounter (Signed)
Dr. Everlene Farrier do you have this paperwork?

## 2014-05-06 NOTE — Telephone Encounter (Signed)
Pt states he brought a form over from the [place] about his blood work and DR. DAUB was to fill out the form since he had his PE done over a week ago. Wanted to know if the form was ready for pick up Please call [phone]

## 2014-05-06 NOTE — Telephone Encounter (Signed)
I believe I have already signed this form. We've please check in the box out front and see if it is ready for pickup

## 2014-05-07 NOTE — Telephone Encounter (Signed)
Received form.  Dr. Everlene Farrier signed.  Pt advised. Form in pick up drawer.

## 2014-08-27 ENCOUNTER — Ambulatory Visit (INDEPENDENT_AMBULATORY_CARE_PROVIDER_SITE_OTHER): Payer: 59 | Admitting: Emergency Medicine

## 2014-08-27 VITALS — BP 146/62 | HR 85 | Temp 97.7°F | Resp 16 | Ht 67.0 in | Wt 264.8 lb

## 2014-08-27 DIAGNOSIS — J209 Acute bronchitis, unspecified: Secondary | ICD-10-CM | POA: Diagnosis not present

## 2014-08-27 DIAGNOSIS — J0140 Acute pansinusitis, unspecified: Secondary | ICD-10-CM

## 2014-08-27 DIAGNOSIS — E119 Type 2 diabetes mellitus without complications: Secondary | ICD-10-CM | POA: Diagnosis not present

## 2014-08-27 MED ORDER — AMOXICILLIN-POT CLAVULANATE 875-125 MG PO TABS: 1.0000 | ORAL_TABLET | Freq: Two times a day (BID) | ORAL | Status: DC

## 2014-08-27 MED ORDER — PSEUDOEPHEDRINE-GUAIFENESIN ER 60-600 MG PO TB12: 1.0000 | ORAL_TABLET | Freq: Two times a day (BID) | ORAL | Status: AC

## 2014-08-27 MED ORDER — HYDROCOD POLST-CHLORPHEN POLST 10-8 MG/5ML PO LQCR: 5.0000 mL | Freq: Two times a day (BID) | ORAL | Status: DC | PRN

## 2014-08-27 NOTE — Patient Instructions (Signed)

## 2014-08-27 NOTE — Progress Notes (Signed)
[facility] [address], [place] [address] [phone]  Date:  08/27/2014   Name:  Nathan Byrd   DOB:  February 18, 1951   MRN:  [id number]  PCP:  Lamar Blinks, MD    Chief Complaint: Cough and Nasal Congestion   History of Present Illness:  Nathan Byrd is a 64 y.o. very pleasant male patient who presents with the following:  Ill with nasal congestion and post nasal drainage.   Purulent in nature Cough productive purulent sputum Some wheezing. No shortness of breath. No nausea or vomiting No fever or chills. No improvement with over the counter medications or other home remedies.  Denies other complaint or health concern today.   Patient Active Problem List   Diagnosis Date Noted  . Overweight and obesity(278.0) 05/05/2013    Past Medical History  Diagnosis Date  . Diabetes mellitus without complication   . Arthritis     s/p B TKR  . Nephrolithiasis     Past Surgical History  Procedure Laterality Date  . Joint replacement      s/p B TKR  . Rotator cuff repair      Bilateral.    History  Substance Use Topics  . Smoking status: Never Smoker   . Smokeless tobacco: Former Systems developer    Types: Chew  . Alcohol Use: 3.6 oz/week    6 Cans of beer per week    Family History  Problem Relation Age of Onset  . Alzheimer's disease Mother   . Heart disease Father 43    AMI    Allergies  Allergen Reactions  . Ace Inhibitors Swelling    angioedema  . Bee Venom   . Penicillins     Medication list has been reviewed and updated.  Current Outpatient Prescriptions on File Prior to Visit  Medication Sig Dispense Refill  . aspirin 81 MG tablet Take 81 mg by mouth daily.    . magnesium 30 MG tablet Take 30 mg by mouth 2 (two) times daily.     No current facility-administered medications on file prior to visit.    Review of Systems:  As per HPI, otherwise negative.    Physical Examination: Filed Vitals:   08/27/14 1546  BP: 146/62   Pulse: 85  Temp: 97.7 F (36.5 C)  Resp: 16   Filed Vitals:   08/27/14 1546  Height: 5\' 7"  (1.702 m)  Weight: 264 lb 12.8 oz (120.112 kg)   Body mass index is 41.46 kg/(m^2). Ideal Body Weight: Weight in (lb) to have BMI = 25: 159.3  GEN: obese, NAD, Non-toxic, A & O x 3 HEENT: Atraumatic, Normocephalic. Neck supple. No masses, No LAD. Ears and Nose: No external deformity. CV: RRR, No M/G/R. No JVD. No thrill. No extra heart sounds. PULM: CTA B, no wheezes, crackles, rhonchi. No retractions. No resp. distress. No accessory muscle use. ABD: S, NT, ND, +BS. No rebound. No HSM. EXTR: No c/c/e NEURO Normal gait.  PSYCH: Normally interactive. Conversant. Not depressed or anxious appearing.  Calm demeanor.    Assessment and Plan: Sinusitis Bronchitis augmentin mucinex tussionex   Signed,  Ellison Carwin, MD

## 2014-09-14 ENCOUNTER — Ambulatory Visit (INDEPENDENT_AMBULATORY_CARE_PROVIDER_SITE_OTHER): Payer: 59 | Admitting: Emergency Medicine

## 2014-09-14 VITALS — BP 128/80 | HR 95 | Temp 98.2°F | Resp 18 | Wt 245.2 lb

## 2014-09-14 DIAGNOSIS — J209 Acute bronchitis, unspecified: Secondary | ICD-10-CM

## 2014-09-14 MED ORDER — LEVOFLOXACIN 500 MG PO TABS: 500.0000 mg | ORAL_TABLET | Freq: Every day | ORAL | Status: AC

## 2014-09-14 MED ORDER — ALBUTEROL SULFATE HFA 108 (90 BASE) MCG/ACT IN AERS: 2.0000 | INHALATION_SPRAY | RESPIRATORY_TRACT | Status: DC | PRN

## 2014-09-14 MED ORDER — HYDROCOD POLST-CHLORPHEN POLST 10-8 MG/5ML PO LQCR: 5.0000 mL | Freq: Two times a day (BID) | ORAL | Status: DC | PRN

## 2014-09-14 NOTE — Addendum Note (Signed)
Addended by: Roselee Culver on: 09/14/2014 09:57 AM   Modules accepted: Orders

## 2014-09-14 NOTE — Progress Notes (Signed)
[facility] [address], [place] [address] [phone]  Date:  09/14/2014   Name:  Nathan Byrd   DOB:  1951-06-04   MRN:  [id number]  PCP:  Lamar Blinks, MD    Chief Complaint: Follow-up   History of Present Illness:  Nathan Byrd is a 64 y.o. very pleasant male patient who presents with the following:  Seen three weeks ago with sinusitis and bronchitis Treated with augmentin Improved for a week. Went to [facility] and attended a wedding. Became ill after trip. Has headache and muscle aches Has a cough with some wheezing.  Sometime purulent. Worse at night. Has frontal headache and very thick purulent nasal drainage No fever or chills No improvement with over the counter medications or other home remedies.  Denies other complaint or health concern today.   Patient Active Problem List   Diagnosis Date Noted  . Diabetes mellitus without complication 12/87/8676  . Overweight and obesity(278.0) 05/05/2013    Past Medical History  Diagnosis Date  . Diabetes mellitus without complication   . Arthritis     s/p B TKR  . Nephrolithiasis     Past Surgical History  Procedure Laterality Date  . Joint replacement      s/p B TKR  . Rotator cuff repair      Bilateral.    History  Substance Use Topics  . Smoking status: Never Smoker   . Smokeless tobacco: Former Systems developer    Types: Chew  . Alcohol Use: 3.6 oz/week    6 Cans of beer per week    Family History  Problem Relation Age of Onset  . Alzheimer's disease Mother   . Heart disease Father 5    AMI    Allergies  Allergen Reactions  . Ace Inhibitors Swelling    angioedema  . Bee Venom   . Penicillins     Medication list has been reviewed and updated.  Current Outpatient Prescriptions on File Prior to Visit  Medication Sig Dispense Refill  . amoxicillin-clavulanate (AUGMENTIN) 875-125 MG per tablet Take 1 tablet by mouth 2 (two) times daily. 20 tablet 0  . aspirin 81 MG  tablet Take 81 mg by mouth daily.    . chlorpheniramine-HYDROcodone (TUSSIONEX PENNKINETIC ER) 10-8 MG/5ML LQCR Take 5 mLs by mouth every 12 (twelve) hours as needed. 60 mL 0  . magnesium 30 MG tablet Take 30 mg by mouth 2 (two) times daily.    . pseudoephedrine-guaifenesin (MUCINEX D) 60-600 MG per tablet Take 1 tablet by mouth every 12 (twelve) hours. 18 tablet 0   No current facility-administered medications on file prior to visit.    Review of Systems:  As per HPI, otherwise negative.    Physical Examination: Filed Vitals:   09/14/14 0914  BP: 128/80  Pulse: 95  Temp: 98.2 F (36.8 C)  Resp: 18   Filed Vitals:   09/14/14 0914  Weight: 245 lb 3.2 oz (111.222 kg)   Body mass index is 38.39 kg/(m^2). Ideal Body Weight:    GEN: WDWN, NAD, Non-toxic, A & O x 3 HEENT: Atraumatic, Normocephalic. Neck supple. No masses, No LAD. Ears and Nose: No external deformity. CV: RRR, No M/G/R. No JVD. No thrill. No extra heart sounds. PULM: CTA B, no wheezes, crackles, rhonchi. No retractions. No resp. distress. No accessory muscle use. ABD: S, NT, ND, +BS. No rebound. No HSM. EXTR: No c/c/e NEURO Normal gait.  PSYCH: Normally interactive. Conversant. Not depressed or  anxious appearing.  Calm demeanor.    Assessment and Plan: Sinusitis Bronchitis mucinex levaquin Albuterol  Signed,  Ellison Carwin, MD

## 2014-09-14 NOTE — Patient Instructions (Signed)
Metered Dose Inhaler (No Spacer Used)  Inhaled medicines are the basis of treatment for asthma and other breathing problems. Inhaled medicine can only be effective if used properly. Good technique assures that the medicine reaches the lungs.  Metered dose inhalers (MDIs) are used to deliver a variety of inhaled medicines. These include quick relief or rescue medicines (such as bronchodilators) and controller medicines (such as corticosteroids). The medicine is delivered by pushing down on a metal canister to release a set amount of spray.  If you are using different kinds of inhalers, use your quick relief medicine to open the airways 10-15 minutes before using a steroid, if instructed to do so by your health care provider. If you are unsure which inhalers to use and the order of using them, ask your health care provider, nurse, or respiratory therapist.  HOW TO USE THE INHALER  1. Remove the cap from the inhaler.  2. If you are using the inhaler for the first time, you will need to prime it. Shake the inhaler for 5 seconds and release four puffs into the air, away from your face. Ask your health care provider or pharmacist if you have questions about priming your inhaler.  3. Shake the inhaler for 5 seconds before each breath in (inhalation).  4. Position the inhaler so that the top of the canister faces up.  5. Put your index finger on the top of the medicine canister. Your thumb supports the bottom of the inhaler.  6. Open your mouth.  7. Either place the inhaler between your teeth and place your lips tightly around the mouthpiece, or hold the inhaler 1-2 inches away from your open mouth. If you are unsure of which technique to use, ask your health care provider.  8. Breathe out (exhale) normally and as completely as possible.  9. Press the canister down with the index finger to release the medicine.  10. At the same time as the canister is pressed, inhale deeply and slowly until your lungs are completely filled.  This should take 4-6 seconds. Keep your tongue down.  11. Hold the medicine in your lungs for 5-10 seconds (10 seconds is best). This helps the medicine get into the small airways of your lungs.  12. Breathe out slowly, through pursed lips. Whistling is an example of pursed lips.  13. Wait at least 1 minute between puffs. Continue with the above steps until you have taken the number of puffs your health care provider has ordered. Do not use the inhaler more than your health care provider directs you to.  14. Replace the cap on the inhaler.  15. Follow the directions from your health care provider or the inhaler insert for cleaning the inhaler.  If you are using a steroid inhaler, after your last puff, rinse your mouth with water, gargle, and spit out the water. Do not swallow the water.  AVOID:  · Inhaling before or after starting the spray of medicine. It takes practice to coordinate your breathing with triggering the spray.  · Inhaling through the nose (rather than the mouth) when triggering the spray.  HOW TO DETERMINE IF YOUR INHALER IS FULL OR NEARLY EMPTY  You cannot know when an inhaler is empty by shaking it. Some inhalers are now being made with dose counters. Ask your health care provider for a prescription that has a dose counter if you feel you need that extra help. If your inhaler does not have a counter, ask your health care   provider to help you determine the date you need to refill your inhaler. Write the refill date on a calendar or your inhaler canister. Refill your inhaler 7-10 days before it runs out. Be sure to keep an adequate supply of medicine. This includes making sure it has not expired, and making sure you have a spare inhaler.  SEEK MEDICAL CARE IF:  · Symptoms are only partially relieved with your inhaler.  · You are having trouble using your inhaler.  · You experience an increase in phlegm.  SEEK IMMEDIATE MEDICAL CARE IF:  · You feel little or no relief with your inhalers. You are still  wheezing and feeling shortness of breath, tightness in your chest, or both.  · You have dizziness, headaches, or a fast heart rate.  · You have chills, fever, or night sweats.  · There is a noticeable increase in phlegm production, or there is blood in the phlegm.  MAKE SURE YOU:  · Understand these instructions.  · Will watch your condition.  · Will get help right away if you are not doing well or get worse.  Document Released: 03/27/2007 Document Revised: 10/14/2013 Document Reviewed: 11/15/2012  ExitCare® Patient Information ©2015 [employer]. This information is not intended to replace advice given to you by your health care provider. Make sure you discuss any questions you have with your health care provider.

## 2014-11-06 ENCOUNTER — Ambulatory Visit (INDEPENDENT_AMBULATORY_CARE_PROVIDER_SITE_OTHER): Payer: 59

## 2014-11-06 ENCOUNTER — Ambulatory Visit (INDEPENDENT_AMBULATORY_CARE_PROVIDER_SITE_OTHER): Payer: 59 | Admitting: Family Medicine

## 2014-11-06 VITALS — BP 140/88 | HR 98 | Temp 98.0°F | Resp 17 | Ht 67.0 in | Wt 264.4 lb

## 2014-11-06 DIAGNOSIS — S99911A Unspecified injury of right ankle, initial encounter: Secondary | ICD-10-CM

## 2014-11-06 DIAGNOSIS — M25571 Pain in right ankle and joints of right foot: Secondary | ICD-10-CM | POA: Diagnosis not present

## 2014-11-06 MED ORDER — NAPROXEN 500 MG PO TABS: 500.0000 mg | ORAL_TABLET | Freq: Two times a day (BID) | ORAL | Status: DC
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
## 2014-11-06 NOTE — Patient Instructions (Signed)
Take naproxen 500 mg twice daily as needed for pain and inflammation. You can take some Tylenol in addition to that if needed, but did not take ibuprofen in addition to it.  Ice the foot several times daily  Wear the Swede-O splint  Return if worse at any time or if not improving over the next 2-3 weeks.  I will let you know if the radiologist sees anything differently

## 2015-01-14 ENCOUNTER — Encounter: Payer: Self-pay | Admitting: *Deleted

## 2015-05-13 ENCOUNTER — Ambulatory Visit (INDEPENDENT_AMBULATORY_CARE_PROVIDER_SITE_OTHER): Payer: 59 | Admitting: Emergency Medicine

## 2015-05-13 VITALS — BP 128/80 | HR 72 | Temp 98.3°F | Resp 16 | Ht 67.0 in | Wt 222.8 lb

## 2015-05-13 DIAGNOSIS — Z0000 Encounter for general adult medical examination without abnormal findings: Secondary | ICD-10-CM

## 2015-05-13 LAB — COMPREHENSIVE METABOLIC PANEL
ALK PHOS: 93 U/L (ref 40–115)
ALT: 22 U/L (ref 9–46)
AST: 22 U/L (ref 10–35)
Albumin: 3.9 g/dL (ref 3.6–5.1)
BUN: 15 mg/dL (ref 7–25)
CO2: 26 mmol/L (ref 20–31)
Calcium: 9.2 mg/dL (ref 8.6–10.3)
Chloride: 101 mmol/L (ref 98–110)
Creat: 0.64 mg/dL — ABNORMAL LOW (ref 0.70–1.25)
GLUCOSE: 77 mg/dL (ref 65–99)
POTASSIUM: 4.5 mmol/L (ref 3.5–5.3)
Sodium: 139 mmol/L (ref 135–146)
Total Bilirubin: 1.4 mg/dL — ABNORMAL HIGH (ref 0.2–1.2)
Total Protein: 6.6 g/dL (ref 6.1–8.1)

## 2015-05-13 LAB — POCT URINALYSIS DIP (MANUAL ENTRY)
BILIRUBIN UA: NEGATIVE
BILIRUBIN UA: NEGATIVE
Blood, UA: NEGATIVE
Glucose, UA: NEGATIVE
LEUKOCYTES UA: NEGATIVE
Nitrite, UA: NEGATIVE
PH UA: 5
Protein Ur, POC: NEGATIVE
SPEC GRAV UA: 1.015
Urobilinogen, UA: 0.2

## 2015-05-13 LAB — CBC
HEMATOCRIT: 42.5 % (ref 39.0–52.0)
Hemoglobin: 14.3 g/dL (ref 13.0–17.0)
MCH: 30 pg (ref 26.0–34.0)
MCHC: 33.6 g/dL (ref 30.0–36.0)
MCV: 89.3 fL (ref 78.0–100.0)
MPV: 11 fL (ref 8.6–12.4)
PLATELETS: 308 10*3/uL (ref 150–400)
RBC: 4.76 MIL/uL (ref 4.22–5.81)
RDW: 13.4 % (ref 11.5–15.5)
WBC: 8.9 10*3/uL (ref 4.0–10.5)

## 2015-05-13 LAB — LIPID PANEL
CHOL/HDL RATIO: 3.2 ratio (ref <=5.0)
Cholesterol: 200 mg/dL (ref 125–200)
HDL: 62 mg/dL (ref >=40)
LDL Cholesterol: 124 mg/dL (ref <130)
Triglycerides: 72 mg/dL (ref <150)
VLDL: 14 mg/dL (ref <30)

## 2015-05-13 NOTE — Progress Notes (Signed)
Subjective:  Patient ID: Nathan Byrd, male    DOB: [birth year]  Age: 64 y.o. MRN: [id number]  CC: Annual Exam   HPI Nathan Byrd presents  for an annual physical examination denies any current medical problems acutely and is not taking any medication regularly he's had a colonoscopy in the last 3 years normal  History Read has a past medical history of Diabetes mellitus without complication ([place]); Arthritis; and Nephrolithiasis.   He has past surgical history that includes Joint replacement and Rotator cuff repair.   His  family history includes Alzheimer's disease in his mother; Heart disease (age of onset: 25) in his father.  He   reports that he has never smoked. He has quit using smokeless tobacco. His smokeless tobacco use included Chew. He reports that he drinks about 3.6 oz of alcohol per week. He reports that he does not use illicit drugs.  Outpatient Prescriptions Prior to Visit  Medication Sig Dispense Refill  . magnesium 30 MG tablet Take 30 mg by mouth 2 (two) times daily.    Marland Kitchen albuterol (PROVENTIL HFA;VENTOLIN HFA) 108 (90 BASE) MCG/ACT inhaler Inhale 2 puffs into the lungs every 4 (four) hours as needed for wheezing or shortness of breath (cough, shortness of breath or wheezing.). (Patient not taking: Reported on 11/06/2014) 1 Inhaler 1  . amoxicillin-clavulanate (AUGMENTIN) 875-125 MG per tablet Take 1 tablet by mouth 2 (two) times daily. (Patient not taking: Reported on 11/06/2014) 20 tablet 0  . aspirin 81 MG tablet Take 81 mg by mouth daily.    . chlorpheniramine-HYDROcodone (TUSSIONEX PENNKINETIC ER) 10-8 MG/5ML LQCR Take 5 mLs by mouth every 12 (twelve) hours as needed. (Patient not taking: Reported on 11/06/2014) 60 mL 0  . naproxen (NAPROSYN) 500 MG tablet Take 1 tablet (500 mg total) by mouth 2 (two) times daily with a meal. (Patient not taking: Reported on 05/13/2015) 30 tablet 0  . pseudoephedrine-guaifenesin (MUCINEX D) 60-600 MG per tablet Take 1 tablet  by mouth every 12 (twelve) hours. (Patient not taking: Reported on 11/06/2014) 18 tablet 0   No facility-administered medications prior to visit.    Social History   Social History  . Marital Status: Married    Spouse Name: N/A  . Number of Children: 1  . Years of Education: N/A   Occupational History  . Truck driver     drives locally   Social History Main Topics  . Smoking status: Never Smoker   . Smokeless tobacco: Former Systems developer    Types: Chew  . Alcohol Use: 3.6 oz/week    6 Cans of beer per week  . Drug Use: No  . Sexual Activity: Yes   Other Topics Concern  . None   Social History Narrative   Marital status:  Married x 43 years; happily      Children:  1 child; 3 grandchildren.      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        Tobacco abuse: chewed for 30 years; quit.      Alcohol:  6 pack per week      Drugs: none      Exercise: none; job very physical.      Seatbelt: 100%      Guns: loaded and unsecured.           Review of Systems  Constitutional: Negative for fever, chills and appetite change.  HENT: Negative for congestion, ear pain,  postnasal drip, sinus pressure and sore throat.   Eyes: Negative for pain and redness.  Respiratory: Negative for cough, shortness of breath and wheezing.   Cardiovascular: Negative for leg swelling.  Gastrointestinal: Negative for nausea, vomiting, abdominal pain, diarrhea, constipation and blood in stool.  Endocrine: Negative for polyuria.  Genitourinary: Negative for dysuria, urgency, frequency and flank pain.  Musculoskeletal: Negative for gait problem.  Skin: Negative for rash.  Neurological: Negative for weakness and headaches.  Psychiatric/Behavioral: Negative for confusion and decreased concentration. The patient is not nervous/anxious.     Objective:  BP 128/80 mmHg  Pulse 72  Temp(Src) 98.3 F (36.8 C) (Oral)  Resp 16  Ht 5\' 7"  (1.702 m)  Wt 222 lb 12.8 oz (101.061  kg)  BMI 34.89 kg/m2  SpO2 96%  Physical Exam  Constitutional: He is oriented to person, place, and time. He appears well-developed and well-nourished. No distress.  HENT:  Head: Normocephalic and atraumatic.  Right Ear: External ear normal.  Left Ear: External ear normal.  Nose: Nose normal.  Eyes: Conjunctivae and EOM are normal. Pupils are equal, round, and reactive to light. No scleral icterus.  Neck: Normal range of motion. Neck supple. No tracheal deviation present.  Cardiovascular: Normal rate, regular rhythm and normal heart sounds.   Pulmonary/Chest: Effort normal. No respiratory distress. He has no wheezes. He has no rales.  Abdominal: He exhibits no mass. There is no tenderness. There is no rebound and no guarding.  Musculoskeletal: He exhibits no edema.  Lymphadenopathy:    He has no cervical adenopathy.  Neurological: He is alert and oriented to person, place, and time. Coordination normal.  Skin: Skin is warm and dry. No rash noted.  Psychiatric: He has a normal mood and affect. His behavior is normal.      Assessment & Plan:   Gemini was seen today for annual exam.  Diagnoses and all orders for this visit:  Annual physical exam -     CBC -     Comprehensive metabolic panel -     Lipid panel -     PSA -     POCT urinalysis dipstick   I am having [name] maintain his magnesium, aspirin, pseudoephedrine-guaifenesin, amoxicillin-clavulanate, albuterol, chlorpheniramine-HYDROcodone, and naproxen.  No orders of the defined types were placed in this encounter.    Appropriate red flag conditions were discussed with the patient as well as actions that should be taken.  Patient expressed his understanding.  Follow-up: Return if symptoms worsen or fail to improve.  Roselee Culver, MD

## 2015-05-14 LAB — PSA: PSA: 2.01 ng/mL (ref <=4.00)

## 2015-06-05 ENCOUNTER — Telehealth: Payer: Self-pay | Admitting: Family Medicine

## 2015-06-05 NOTE — Telephone Encounter (Signed)
Spoke with patient and he is going to bring [place] [facility] A1C and microalbumin and eye exam.  He will be going to them on January 6 and then he will bring a copy by to [place] after that.

## 2015-07-03 ENCOUNTER — Encounter: Payer: Self-pay | Admitting: Family Medicine

## 2015-07-08 ENCOUNTER — Encounter: Payer: Self-pay | Admitting: Family Medicine
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
## 2016-04-21 ENCOUNTER — Ambulatory Visit (INDEPENDENT_AMBULATORY_CARE_PROVIDER_SITE_OTHER): Payer: Self-pay | Admitting: Physician Assistant

## 2016-04-21 VITALS — BP 136/88 | HR 71 | Temp 97.7°F | Resp 16 | Ht 66.0 in | Wt 226.0 lb

## 2016-04-21 DIAGNOSIS — Z024 Encounter for examination for driving license: Secondary | ICD-10-CM

## 2016-04-21 NOTE — Progress Notes (Signed)
This patient presents for DOT examination for fitness for duty.  Shoulder surgery 12/29/2015 - rotator cuff repair - still in therapy - he has been released from the surgery.  Medical History:  no  1. Head/Brain Injuries, disorders or illnesses no  2. Seizures, epilepsy no  3. Eye disorders or impaired vision (except corrective lenses) - wears glasses to drive no  4. Ear disorders, loss of hearing or balance no  5. Heart disease or heart attack, other cardiovascular condition no  6. Heart surgery (valve replacement/bypass, angioplasty, pacemaker/defribrillator) no 7. High blood pressure - in the past yes  8. High holesterol no  9. Chronic cough, shortness of breath or other breathing problems no  10. Lung disease (emphysema, asthma or chronic bronchitis) no  11. Kidney disease, dialysis no  12. Digestive problems  yes  13. Diabetes or elevated blood sugar  no  Insulin use yes  14. Nervous or psychiatric disorders, e.g., severe depression - mild depression - treated with medications no  15. Fainting or syncope no  16. Dizziness, headaches, numbness, tingling or memory loss no  17. Unexplained weight loss no  18. Stroke, TIA or paralysis no  19. Missing or impaired hand, arm, foot, leg, finger, toe no  20. Spinal injury or disease no  21. Bone, muscles or nerve problems no  22. Blood clots or bleeding bleeding disorders no  23. Cancer no  24. Chronic infection or other chronic diseases no  25. Sleep disorders, pauses in breathing while asleep, daytime sleepiness, loud snoring no  26. Have you ever had a sleep test? yes  27.  Have you ever spent a night in the hospital? - after knee replacement yes  28. Have you ever had a broken bone? yes  29. Have you or or do you use tobacco products? Oral tobacco - rare snuff no  30. Regular, frequent alcohol use no  31. Illegal substance use within the past 2 years no  32.  Have you ever failed a drug test or been dependent on an illegal  substance?  Current Medications: Prior to Admission medications   Medication Sig Start Date End Date Taking? Authorizing Provider  aspirin 81 MG tablet Take 81 mg by mouth daily.   Yes Historical Provider, MD  magnesium 30 MG tablet Take 30 mg by mouth 2 (two) times daily.   Yes Historical Provider, MD    Medical Examiner's Comments on Health History:  Pt had surgery on 12/2015 for rotator cuff surgery  TESTING:   Visual Acuity Screening   Right eye Left eye Both eyes  Without correction:     With correction: 20/30 20/30 20/25   Comments: Peripheral Vision: Right eye 85 degrees. Left eye 85 degrees.  The patient can distinguish the colors red, amber and green.  Hearing Screening Comments: The patient was able to hear a forced whisper from 10 feet.  Monocular Vision: No.  Hearing Aid used for test: No. Hearing Aid required to to meet standard: No.  BP 136/88   Pulse 71   Temp 97.7 F (36.5 C) (Oral)   Resp 16   Ht 5\' 6"  (1.676 m)   Wt 226 lb (102.5 kg)   SpO2 96%   BMI 36.48 kg/m  Pulse rate is regular  Comments: sp. gr. 1.020, protein neg., blood neg., sugar neg.   PHYSICAL EXAMINATION:  1. No. General Appearance: Marked overweight, tremor, signs of alcoholism, problem drinking or drug abuse. 2. No. Skin Exam - tattoos, scars 3. No.  Eyes: pupillary equality, reaction to light, accommodation, ocular motility, ocular muscle imbalance, extra ocular movement, nystagmus, exopthalmos. Ask about retinopathy, cataracts, aphakia, glaucoma, macular degeneration and refer to a specialist if appropriate.  4. No. Ears: Scarring of tympanic membrane, occlusion of external canal, perforated eardrums.     5. No. Mouth and Throat: Irremedial deformities likely to interfere with breathing or swallowing.    6. No. Heart: Murmurs, extra sounds, enlarged heart, pacemaker, implantable defibrillator.     7. No. Lungs and Chest, not including breast examination: Abnormal Chest wall  expansion, abnormal respiratory rate, abnormal breath sounds including wheezes or alveolar rates, impaired respiratory function, cyanosis. Abnormal findings on physical exam may require further testing such as pulmonary tests and/or x ray of chest.  8. No. Abdomen and Viscera: Enlarged liver, enlarged spleen, masses, bruits, hernia, significant abdominal wall muscle weakness.  9. No. Genitourinary System: Hernia  10. No. Spine, other musculoskeletal: Previous surgery, deformities, limitation of motion, tenderness. 11. No. Extremities-Limb impaired: Loss or impairment of leg, foot, toe, arm, hand, finger. Perceptible limp, deformities, atrophy, weakness, paralysis, clubbing, edema, hypotonia. Insufficient grasp and prehension to maintain steering wheel grip. Insufficient mobility and strength in lower limb to operate pedals properly. 12. No. Neurological: Impaired equilibrium, coordination or speech pattern; paresthesia, asymmetric deep tendon reflexes, sensory or positional abnormalities, abnormal patellar and Babinski's reflexes 13. No. Gait - antalgic, ataxia  14. No. Vascular System: Abnormal pulse and amplitude, carotid or arterial bruits, varicose veins.   Does not meet standards. Certification Status: does meet standards for 2 year certificate. \    Wearing corrective lenses: yes Wearing hearing aid: no Accompanied by a no waiver/exemption Skill performance Evaluation (SPE) Certificate: no Driving within an exempt intracity zone: no Qualified by operation of 49 CFR [id number]: no  Certification expires 04/21/2018  Windell Hummingbird PA-C  [facility] 04/21/2016 9:03 AM

## 2016-04-26 ENCOUNTER — Telehealth: Payer: Self-pay | Admitting: Family Medicine

## 2016-04-26 NOTE — Telephone Encounter (Signed)
Pt called in because she says that she and her spouse both need to have CPE before the end of the year. Pt says that it is needed for work insurance. Is it okay to work pt in sooner?

## 2016-04-27 NOTE — Telephone Encounter (Signed)
Yes that will be fine. 

## 2016-05-12 ENCOUNTER — Encounter: Payer: Self-pay | Admitting: Family Medicine

## 2016-05-12 ENCOUNTER — Ambulatory Visit (INDEPENDENT_AMBULATORY_CARE_PROVIDER_SITE_OTHER): Payer: 59 | Admitting: Family Medicine

## 2016-05-12 VITALS — BP 126/84 | HR 73 | Temp 97.8°F | Ht 67.0 in | Wt 225.4 lb

## 2016-05-12 DIAGNOSIS — Z0000 Encounter for general adult medical examination without abnormal findings: Secondary | ICD-10-CM

## 2016-05-12 DIAGNOSIS — Z130 Encounter for screening for diseases of the blood and blood-forming organs and certain disorders involving the immune mechanism: Secondary | ICD-10-CM

## 2016-05-12 DIAGNOSIS — E785 Hyperlipidemia, unspecified: Secondary | ICD-10-CM

## 2016-05-12 DIAGNOSIS — N529 Male erectile dysfunction, unspecified: Secondary | ICD-10-CM

## 2016-05-12 DIAGNOSIS — Z125 Encounter for screening for malignant neoplasm of prostate: Secondary | ICD-10-CM

## 2016-05-12 DIAGNOSIS — Z119 Encounter for screening for infectious and parasitic diseases, unspecified: Secondary | ICD-10-CM

## 2016-05-12 DIAGNOSIS — Z13220 Encounter for screening for lipoid disorders: Secondary | ICD-10-CM

## 2016-05-12 DIAGNOSIS — E119 Type 2 diabetes mellitus without complications: Secondary | ICD-10-CM

## 2016-05-12 LAB — CBC
HCT: 43.3 % (ref 39.0–52.0)
HEMOGLOBIN: 14.7 g/dL (ref 13.0–17.0)
MCHC: 34 g/dL (ref 30.0–36.0)
MCV: 89.7 fl (ref 78.0–100.0)
PLATELETS: 307 10*3/uL (ref 150.0–400.0)
RBC: 4.83 Mil/uL (ref 4.22–5.81)
RDW: 13.5 % (ref 11.5–15.5)
WBC: 7.5 10*3/uL (ref 4.0–10.5)

## 2016-05-12 LAB — COMPREHENSIVE METABOLIC PANEL
ALBUMIN: 4.2 g/dL (ref 3.5–5.2)
ALT: 20 U/L (ref 0–53)
AST: 20 U/L (ref 0–37)
Alkaline Phosphatase: 80 U/L (ref 39–117)
BILIRUBIN TOTAL: 1.1 mg/dL (ref 0.2–1.2)
BUN: 14 mg/dL (ref 6–23)
CALCIUM: 9.5 mg/dL (ref 8.4–10.5)
CO2: 29 meq/L (ref 19–32)
CREATININE: 0.72 mg/dL (ref 0.40–1.50)
Chloride: 103 mEq/L (ref 96–112)
GFR: 115.77 mL/min (ref >60.00)
Glucose, Bld: 97 mg/dL (ref 70–99)
Potassium: 4.4 mEq/L (ref 3.5–5.1)
Sodium: 140 mEq/L (ref 135–145)
Total Protein: 6.7 g/dL (ref 6.0–8.3)

## 2016-05-12 LAB — LIPID PANEL
CHOL/HDL RATIO: 4
CHOLESTEROL: 218 mg/dL — AB (ref 0–200)
HDL: 62.3 mg/dL (ref >39.00)
LDL Cholesterol: 143 mg/dL — ABNORMAL HIGH (ref 0–99)
NonHDL: 155.76
TRIGLYCERIDES: 64 mg/dL (ref 0.0–149.0)
VLDL: 12.8 mg/dL (ref 0.0–40.0)

## 2016-05-12 LAB — HEMOGLOBIN A1C: Hgb A1c MFr Bld: 5.8 % (ref 4.6–6.5)

## 2016-05-12 LAB — PSA: PSA: 2.31 ng/mL (ref 0.10–4.00)

## 2016-05-12 MED ORDER — LOVASTATIN 20 MG PO TABS: 20.0000 mg | ORAL_TABLET | Freq: Every day | ORAL | 3 refills | Status: DC

## 2016-05-12 MED ORDER — SILDENAFIL CITRATE 100 MG PO TABS: 50.0000 mg | ORAL_TABLET | Freq: Every day | ORAL | 11 refills | Status: DC | PRN

## 2016-05-12 NOTE — Progress Notes (Addendum)
[place] at [facility] [address] [place], [place] [address] [phone] [phone]  Date:  05/12/2016   Name:  Nathan Byrd   DOB:  1951/05/01   MRN:  [id number]  PCP:  Lamar Blinks, MD    Chief Complaint: Annual Exam (Pt here CPE and pt is fasting for lab work. Due for flu vaccine today. )   History of Present Illness:  Nathan Byrd is a 65 y.o. very pleasant male patient who presents with the following:  Here today for a CPE- he is fasting today for labs.   Last labs about one year ago He is not on any DM medications  He does use some snuff at times- he uses this every day. He used to chew tobacco- knows that she should stop using tobacco products for his helalth  Wt Readings from Last 3 Encounters:  05/12/16 225 lb 6.4 oz (102.2 kg)  04/21/16 226 lb (102.5 kg)  05/13/15 222 lb 12.8 oz (101.1 kg)   He just returned to work following a shoulder operation- he did PT for 3 months and is finally back to his usually activities.   He has been using some viagra that he got from a friend; this has worked well for him. He has been taking the 50 mg dose. No CP, SOB, priapism noted. He would like to have an rx for same  Due for a flu shot today He thinks he had a shingles vaccine and pneumonia vaccine per the [place]- he gets his colonoscopy and eye care per the [place] as well Lab Results  Component Value Date   HGBA1C 6.1 (H) 05/05/2013    Patient Active Problem List   Diagnosis Date Noted  . Diet-controlled diabetes mellitus ([place]) 08/27/2014  . Overweight and obesity(278.0) 05/05/2013    Past Medical History:  Diagnosis Date  . Arthritis    s/p B TKR  . Diabetes mellitus without complication ([place])   . Nephrolithiasis     Past Surgical History:  Procedure Laterality Date  . JOINT REPLACEMENT     s/p B TKR  . ROTATOR CUFF REPAIR     Bilateral.    Social History  Substance Use Topics  . Smoking status: Never Smoker  .  Smokeless tobacco: Former Systems developer    Types: Chew  . Alcohol use 3.6 oz/week    6 Cans of beer per week    Family History  Problem Relation Age of Onset  . Alzheimer's disease Mother   . Heart disease Father 39    AMI    Allergies  Allergen Reactions  . Ace Inhibitors Swelling    angioedema  . Bee Venom   . Penicillins     Medication list has been reviewed and updated.  Current Outpatient Prescriptions on File Prior to Visit  Medication Sig Dispense Refill  . aspirin 81 MG tablet Take 81 mg by mouth daily.    . magnesium 30 MG tablet Take 30 mg by mouth 2 (two) times daily.    . naproxen (NAPROSYN) 500 MG tablet     . sertraline (ZOLOFT) 100 MG tablet      No current facility-administered medications on file prior to visit.     Review of Systems:  As per HPI- otherwise negative.  No fever, chills, nausea, vomiting, rash   Physical Examination: Vitals:   05/12/16 1049  BP: 126/84  Pulse: 73  Temp: 97.8 F (36.6 C)   Vitals:  05/12/16 1049  Weight: 225 lb 6.4 oz (102.2 kg)  Height: 5\' 7"  (1.702 m)   Body mass index is 35.3 kg/m. Ideal Body Weight: Weight in (lb) to have BMI = 25: 159.3  GEN: WDWN, NAD, Non-toxic, A & O x 3, overweight, otherwise looks well HEENT: Atraumatic, Normocephalic. Neck supple. No masses, No LAD. Ears and Nose: No external deformity. CV: RRR, No M/G/R. No JVD. No thrill. No extra heart sounds. PULM: CTA B, no wheezes, crackles, rhonchi. No retractions. No resp. distress. No accessory muscle use. ABD: S, NT, ND, +BS. No rebound. No HSM. EXTR: No c/c/e NEURO Normal gait.  PSYCH: Normally interactive. Conversant. Not depressed or anxious appearing.  Calm demeanor.    Assessment and Plan: Physical exam  Screening for deficiency anemia - Plan: CBC  Screening for prostate cancer - Plan: PSA  Screening for hyperlipidemia - Plan: Lipid panel  Diet-controlled diabetes mellitus ([place]) - Plan: Comprehensive metabolic panel, Hemoglobin  A1c  Erectile dysfunction, unspecified erectile dysfunction type - Plan: sildenafil (VIAGRA) 100 MG tablet  Here today for a CPE Labs pending as above Flu shot today rx for viagra to use as needed for ED Will plan further follow- up pending labs.   Signed Lamar Blinks, MD  Received his labs  Results for orders placed or performed in visit on 05/12/16  CBC  Result Value Ref Range   WBC 7.5 4.0 - 10.5 K/uL   RBC 4.83 4.22 - 5.81 Mil/uL   Platelets 307.0 150.0 - 400.0 K/uL   Hemoglobin 14.7 13.0 - 17.0 g/dL   HCT 43.3 39.0 - 52.0 %   MCV 89.7 78.0 - 100.0 fl   MCHC 34.0 30.0 - 36.0 g/dL   RDW 13.5 11.5 - 15.5 %  Comprehensive metabolic panel  Result Value Ref Range   Sodium 140 135 - 145 mEq/L   Potassium 4.4 3.5 - 5.1 mEq/L   Chloride 103 96 - 112 mEq/L   CO2 29 19 - 32 mEq/L   Glucose, Bld 97 70 - 99 mg/dL   BUN 14 6 - 23 mg/dL   Creatinine, Ser 0.72 0.40 - 1.50 mg/dL   Total Bilirubin 1.1 0.2 - 1.2 mg/dL   Alkaline Phosphatase 80 39 - 117 U/L   AST 20 0 - 37 U/L   ALT 20 0 - 53 U/L   Total Protein 6.7 6.0 - 8.3 g/dL   Albumin 4.2 3.5 - 5.2 g/dL   Calcium 9.5 8.4 - 10.5 mg/dL   GFR 115.77 >60.00 mL/min  Lipid panel  Result Value Ref Range   Cholesterol 218 (H) 0 - 200 mg/dL   Triglycerides 64.0 0.0 - 149.0 mg/dL   HDL 62.30 >39.00 mg/dL   VLDL 12.8 0.0 - 40.0 mg/dL   LDL Cholesterol 143 (H) 0 - 99 mg/dL   Total CHOL/HDL Ratio 4    NonHDL 155.76   PSA  Result Value Ref Range   PSA 2.31 0.10 - 4.00 ng/mL  Hemoglobin A1c  Result Value Ref Range   Hgb A1c MFr Bld 5.8 4.6 - 6.5 %   DM is under good control, PSA is stable from last year 10 year CV risk- 25 % if DM, 15% if not DM.   Called pt- he is willing to try a statin. He has had some aches with same in the past- we will try lovastatin, he can take every 2-3 days if needed to prevent SE.  He will let me know if he cannot tolerate this

## 2016-05-12 NOTE — Patient Instructions (Signed)
It was a pleasure to see you today- please do get your immunization records from the [place] for me at your next visit You got your flu shot today, and we will check your labs as below Try the viagra as needed- let me know if any problems using this medication I would encourage you to stop using snuff due to the risk of mouth and tongue cancer!

## 2016-05-12 NOTE — Progress Notes (Signed)
Pre visit review using our clinic review tool, if applicable. No additional management support is needed unless otherwise documented below in the visit note. 

## 2016-05-12 NOTE — Addendum Note (Signed)
Addended by: Lamar Blinks C on: 05/12/2016 02:55 PM   Modules accepted: Orders

## 2016-05-13 LAB — HEPATITIS C ANTIBODY: HCV AB: NEGATIVE

## 2016-06-15 ENCOUNTER — Telehealth: Payer: Self-pay | Admitting: Family Medicine

## 2016-06-15 NOTE — Telephone Encounter (Signed)
Received records from [facility] regarding recent immunizations and MDD

## 2016-10-14 ENCOUNTER — Telehealth: Payer: Self-pay | Admitting: Family Medicine

## 2016-10-14 MED ORDER — TADALAFIL 20 MG PO TABS: 10.0000 mg | ORAL_TABLET | ORAL | 11 refills | Status: DC | PRN

## 2016-10-14 NOTE — Telephone Encounter (Signed)
Called pt- viagra does help but results are not as consistent as he would like.  rx for cialis called in, he will let me know if this is not helpful

## 2016-10-14 NOTE — Telephone Encounter (Signed)
Pt's spouse called in to make PCP aware that Viagra isn't working for pt. Per spouse pt says that if Viagra doesn't work PCP will call in something else. Pt would like to know if provider would send him something else in.    Pharmacy: [facility] [place], [place]

## 2018-01-02 NOTE — Progress Notes (Addendum)
[place] at [facility] [address] [place], [place] [address] [phone]  Date:  01/03/2018   Name:  Nathan Byrd   DOB:  12-10-1950   MRN:  [id number]  PCP:  Darreld Mclean, MD    Chief Complaint: Prostate Check (pain in testicles); Urinary Frequency (trouble emptying bladder, no blood or burning); and Pulled Muscle (right side-buttocks-pulled playing golf)   History of Present Illness:  Nathan Byrd is a 67 y.o. very pleasant male patient who presents with the following:  History of dyslipidemia, obesity and diet controlled DM[facility]   Lab Results  Component Value Date   HGBA1C 5.8 05/12/2016   Last seen here in 2017, he does get some of his care at the [place] as well:  Here today for a CPE- he is fasting today for labs.   Last labs about one year ago He is not on any DM medications He does use some snuff at times- he uses this every day. He used to chew tobacco- knows that she should stop using tobacco products for his helalth     Wt Readings from Last 3 Encounters:  05/12/16 225 lb 6.4 oz (102.2 kg)  04/21/16 226 lb (102.5 kg)  05/13/15 222 lb 12.8 oz (101.1 kg)   He just returned to work following a shoulder operation- he did PT for 3 months and is finally back to his usually activities.  He has been using some viagra that he got from a friend; this has worked well for him. He has been taking the 50 mg dose. No CP, SOB, priapism noted. He would like to have an rx for same He thinks he had a shingles vaccine and pneumonia vaccine per the [place]- he gets his colonoscopy and eye care per the [place] as well   Lab Results  Component Value Date   PSA 3.22 01/03/2018   PSA 2.31 05/12/2016   PSA 2.01 05/13/2015   I had started him on a statin last year for dyslipidemia but he is not longer taking this  He has had some pain in his perineum- he has noted this for a year or so.  He went to the [place] and  saw his new doctor recently but did not happen to mention this.  He does relate a history of prostatitis in the past on a couple of occasions and this is similar  He feels like he is not emptying his bladder complete for the last 6 months or so No dysuria No blood in urine He is not sure if the [place] checked his PSA more recently   He slipped while swinging a golf club and pulled a muscle deep in his buttock about 3 months ago He notes a slight pain still He is able to have a BM He only feels it when he sits in a hard chair  This is getting better and he can do all his normal activities including playing golf today Accompanied by his wife Lattie Haw who contributes to the history today   Patient Active Problem List   Diagnosis Date Noted  . Dyslipidemia 05/12/2016  . Diet-controlled diabetes mellitus ([place]) 08/27/2014  . Overweight and obesity(278.0) 05/05/2013    Past Medical History:  Diagnosis Date  . Arthritis    s/p B TKR  . Diabetes mellitus without complication ([place])   . Nephrolithiasis     Past Surgical History:  Procedure Laterality  Date  . JOINT REPLACEMENT     s/p B TKR  . ROTATOR CUFF REPAIR     Bilateral.    Social History   Tobacco Use  . Smoking status: Never Smoker  . Smokeless tobacco: Former Systems developer    Types: Chew  Substance Use Topics  . Alcohol use: Yes    Alcohol/week: 3.6 oz    Types: 6 Cans of beer per week  . Drug use: No    Family History  Problem Relation Age of Onset  . Alzheimer's disease Mother   . Heart disease Father 11       AMI    Allergies  Allergen Reactions  . Ace Inhibitors Swelling    angioedema  . Bee Venom   . Penicillins     Medication list has been reviewed and updated.  Current Outpatient Medications on File Prior to Visit  Medication Sig Dispense Refill  . aspirin 81 MG tablet Take 81 mg by mouth daily.    . magnesium 30 MG tablet Take 30 mg by mouth 2 (two) times daily.    . naproxen (NAPROSYN) 500 MG tablet     .  sertraline (ZOLOFT) 100 MG tablet      No current facility-administered medications on file prior to visit.     Review of Systems:  As per HPI- otherwise negative.   Physical Examination: Vitals:   01/03/18 1107  BP: 130/90  Pulse: 82  Resp: 16  SpO2: 95%   Vitals:   01/03/18 1107  Weight: 240 lb (108.9 kg)  Height: 5\' 7"  (1.702 m)   Body mass index is 37.59 kg/m. Ideal Body Weight: Weight in (lb) to have BMI = 25: 159.3  GEN: WDWN, NAD, Non-toxic, A & O x 3, obese, looks well  HEENT: Atraumatic, Normocephalic. Neck supple. No masses, No LAD.  Bilateral TM wnl, oropharynx normal.  PEERL,EOMI.   Ears and Nose: No external deformity. CV: RRR, No M/G/R. No JVD. No thrill. No extra heart sounds. PULM: CTA B, no wheezes, crackles, rhonchi. No retractions. No resp. distress. No accessory muscle use. ABD: S, NT, ND, +BS. No rebound. No HSM. EXTR: No c/c/e NEURO Normal gait.  PSYCH: Normally interactive. Conversant. Not depressed or anxious appearing.  Calm demeanor.  Genitals normal and non- tender Prostate is smooth and not acutely tender to palpation  He has mild tenderness deep in the right buttock, likely a pulled glute muscle.  Some pain with bending forward but no weakness    Assessment and Plan: Screening for prostate cancer - Plan: PSA  Buttock pain  Urinary frequency - Plan: PSA, POCT urinalysis dipstick, Urine Culture, tadalafil (CIALIS) 5 MG tablet  Dyslipidemia - Plan: Basic metabolic panel, Lipid panel  Prostatitis, unspecified prostatitis type - Plan: sulfamethoxazole-trimethoprim (BACTRIM DS,SEPTRA DS) 800-160 MG tablet  Benign prostatic hyperplasia with incomplete bladder emptying - Plan: tadalafil (CIALIS) 5 MG tablet  Here today with sx of likely BPH and perhaps prostatitis.  He is already using prn cialis for ED.  Will change to daily lower dose cialis for BPH Treat for possible mild prostatitis with septra for 2 weeks  Await urine culture and  BW  Signed Lamar Blinks, MD Called pt re his labs so far.  PSA has gone up I am going to refer to urology for consultation. They state understanding  Await urine culture and will send a lab letter when this comes in  Received urine culture, 7/26- negative Letter to mail to patient  Results for orders placed or performed in visit on 77/41/28  Basic metabolic panel  Result Value Ref Range   Sodium 141 135 - 145 mEq/L   Potassium 4.5 3.5 - 5.1 mEq/L   Chloride 101 96 - 112 mEq/L   CO2 31 19 - 32 mEq/L   Glucose, Bld 106 (H) 70 - 99 mg/dL   BUN 13 6 - 23 mg/dL   Creatinine, Ser 0.76 0.40 - 1.50 mg/dL   Calcium 9.8 8.4 - 10.5 mg/dL   GFR 108.23 >60.00 mL/min  PSA  Result Value Ref Range   PSA 3.22 0.10 - 4.00 ng/mL  Lipid panel  Result Value Ref Range   Cholesterol 218 (H) 0 - 200 mg/dL   Triglycerides 146.0 0.0 - 149.0 mg/dL   HDL 61.00 >39.00 mg/dL   VLDL 29.2 0.0 - 40.0 mg/dL   LDL Cholesterol 128 (H) 0 - 99 mg/dL   Total CHOL/HDL Ratio 4    NonHDL 157.10   POCT urinalysis dipstick  Result Value Ref Range   Color, UA yellow yellow   Clarity, UA clear clear   Glucose, UA negative negative mg/dL   Bilirubin, UA negative negative   Ketones, POC UA negative negative mg/dL   Spec Grav, UA >=1.030 (A) 1.010 - 1.025   Blood, UA negative negative   pH, UA 5.5 5.0 - 8.0   Protein Ur, POC negative negative mg/dL   Urobilinogen, UA 0.2 0.2 or 1.0 E.U./dL   Nitrite, UA Negative Negative   Leukocytes, UA Negative Negative

## 2018-01-03 ENCOUNTER — Encounter: Payer: Self-pay | Admitting: Family Medicine

## 2018-01-03 ENCOUNTER — Ambulatory Visit (INDEPENDENT_AMBULATORY_CARE_PROVIDER_SITE_OTHER): Payer: Medicare Other | Admitting: Family Medicine

## 2018-01-03 VITALS — BP 130/90 | HR 82 | Resp 16 | Ht 67.0 in | Wt 240.0 lb

## 2018-01-03 DIAGNOSIS — Z125 Encounter for screening for malignant neoplasm of prostate: Secondary | ICD-10-CM | POA: Diagnosis not present

## 2018-01-03 DIAGNOSIS — M7918 Myalgia, other site: Secondary | ICD-10-CM

## 2018-01-03 DIAGNOSIS — R350 Frequency of micturition: Secondary | ICD-10-CM

## 2018-01-03 DIAGNOSIS — N419 Inflammatory disease of prostate, unspecified: Secondary | ICD-10-CM | POA: Diagnosis not present

## 2018-01-03 DIAGNOSIS — N401 Enlarged prostate with lower urinary tract symptoms: Secondary | ICD-10-CM

## 2018-01-03 DIAGNOSIS — R972 Elevated prostate specific antigen [PSA]: Secondary | ICD-10-CM

## 2018-01-03 DIAGNOSIS — E785 Hyperlipidemia, unspecified: Secondary | ICD-10-CM

## 2018-01-03 DIAGNOSIS — R3914 Feeling of incomplete bladder emptying: Secondary | ICD-10-CM

## 2018-01-03 LAB — POCT URINALYSIS DIP (MANUAL ENTRY)
Bilirubin, UA: NEGATIVE
Blood, UA: NEGATIVE
GLUCOSE UA: NEGATIVE mg/dL
Ketones, POC UA: NEGATIVE mg/dL
LEUKOCYTES UA: NEGATIVE
Nitrite, UA: NEGATIVE
PROTEIN UA: NEGATIVE mg/dL
Spec Grav, UA: >=1.03 — AB (ref 1.010–1.025)
UROBILINOGEN UA: 0.2 U/dL
pH, UA: 5.5 (ref 5.0–8.0)

## 2018-01-03 LAB — BASIC METABOLIC PANEL
BUN: 13 mg/dL (ref 6–23)
CHLORIDE: 101 meq/L (ref 96–112)
CO2: 31 meq/L (ref 19–32)
Calcium: 9.8 mg/dL (ref 8.4–10.5)
Creatinine, Ser: 0.76 mg/dL (ref 0.40–1.50)
GFR: 108.23 mL/min (ref >60.00)
GLUCOSE: 106 mg/dL — AB (ref 70–99)
Potassium: 4.5 mEq/L (ref 3.5–5.1)
Sodium: 141 mEq/L (ref 135–145)

## 2018-01-03 LAB — LIPID PANEL
CHOL/HDL RATIO: 4
CHOLESTEROL: 218 mg/dL — AB (ref 0–200)
HDL: 61 mg/dL (ref >39.00)
LDL Cholesterol: 128 mg/dL — ABNORMAL HIGH (ref 0–99)
NONHDL: 157.1
TRIGLYCERIDES: 146 mg/dL (ref 0.0–149.0)
VLDL: 29.2 mg/dL (ref 0.0–40.0)

## 2018-01-03 LAB — PSA: PSA: 3.22 ng/mL (ref 0.10–4.00)

## 2018-01-03 MED ORDER — SULFAMETHOXAZOLE-TRIMETHOPRIM 800-160 MG PO TABS: 1.0000 | ORAL_TABLET | Freq: Two times a day (BID) | ORAL | 0 refills | Status: DC

## 2018-01-03 MED ORDER — TADALAFIL 5 MG PO TABS: 5.0000 mg | ORAL_TABLET | Freq: Every day | ORAL | 11 refills | Status: DC

## 2018-01-03 NOTE — Patient Instructions (Signed)
Good to see you today!  Please stop by the lab for a blood draw and I will be in touch with your results asap We are going to try daily 5 mg cialis for prostate enlargement.  If not affordable please alert me!  We wil treat for possible prostate infection with antibiotics for 2 weeks  Please keep me posted as to how you are doing!

## 2018-01-04 LAB — URINE CULTURE
MICRO NUMBER:: 90875818
RESULT: NO GROWTH
SPECIMEN QUALITY: ADEQUATE

## 2018-01-05 MED ORDER — TADALAFIL 5 MG PO TABS: 5.0000 mg | ORAL_TABLET | Freq: Every day | ORAL | 11 refills | Status: DC

## 2018-01-05 NOTE — Addendum Note (Signed)
Addended by: Lamar Blinks C on: 01/05/2018 06:35 AM   Modules accepted: Orders

## 2018-01-09 ENCOUNTER — Other Ambulatory Visit: Payer: Self-pay | Admitting: Family Medicine

## 2018-01-09 NOTE — Progress Notes (Signed)
Received message from his wife Dawn's mychart account as Lemon does not have his set up. We had planned to try low dose daily cialis for BPH    Hey Dr Lorelei Pont, Jahmeer's med for cialis was going to be $265.00 not covered by Medicare. [place] so please advise what you want [place] to do. Thank You,  In this case would recommend that we use flomax, but in this case would need to DC his prn cialis.  Will ask pt if he prefers to do this. Or could refer to urology for options

## 2018-04-23 ENCOUNTER — Ambulatory Visit (INDEPENDENT_AMBULATORY_CARE_PROVIDER_SITE_OTHER): Payer: Medicare Other

## 2018-04-23 DIAGNOSIS — Z23 Encounter for immunization: Secondary | ICD-10-CM | POA: Diagnosis not present

## 2018-07-01 DIAGNOSIS — J329 Chronic sinusitis, unspecified: Secondary | ICD-10-CM | POA: Diagnosis not present

## 2018-07-01 DIAGNOSIS — J069 Acute upper respiratory infection, unspecified: Secondary | ICD-10-CM | POA: Diagnosis not present

## 2018-08-18 DIAGNOSIS — R05 Cough: Secondary | ICD-10-CM | POA: Diagnosis not present

## 2019-02-14 DIAGNOSIS — L814 Other melanin hyperpigmentation: Secondary | ICD-10-CM | POA: Diagnosis not present

## 2019-02-14 DIAGNOSIS — L570 Actinic keratosis: Secondary | ICD-10-CM | POA: Diagnosis not present

## 2019-02-14 DIAGNOSIS — L820 Inflamed seborrheic keratosis: Secondary | ICD-10-CM | POA: Diagnosis not present

## 2019-02-14 DIAGNOSIS — L821 Other seborrheic keratosis: Secondary | ICD-10-CM | POA: Diagnosis not present

## 2019-02-14 DIAGNOSIS — D485 Neoplasm of uncertain behavior of skin: Secondary | ICD-10-CM | POA: Diagnosis not present

## 2019-04-03 DIAGNOSIS — R350 Frequency of micturition: Secondary | ICD-10-CM | POA: Diagnosis not present

## 2019-04-10 NOTE — Progress Notes (Addendum)
[place] at [facility] [address] [place], [place] [address] [phone]  Date:  04/15/2019   Name:  Nathan Byrd   DOB:  01/31/1951   MRN:  [id number]  PCP:  Darreld Mclean, MD    Chief Complaint: Diabetes (flu shot) and Hypertension   History of Present Illness:  Nathan Byrd is a 68 y.o. very pleasant male patient who presents with the following:  Today for a follow-up visit and flu shot History of overweight, dyslipidemia, diet-controlled diabetes-he also gets some of his care at the [place]  Last seen by myself in July 2019-at that time his PSA had gone up, I referred him to urology for follow-up He saw his urologist last week, PSA was down to 1.1 approx, return to normal screening  Lab Results  Component Value Date   HGBA1C 5.8 05/12/2016   Foot exam due Eye exam- this is scheduled for march Due dose of Pneumovax- give today  Flu shot- give today  Labs and A1c-due to today  Colon cancer screening due next year-can offer Cologuard Shingrix  Married to [place]  He is checking his BP at home some- he is concerned that it was high It was high at [facility]DS recently and he was not able to do his dental work.  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 Dyslipidemia 05/12/2016  . Diet-controlled diabetes mellitus ([place]) 08/27/2014  . Overweight and obesity(278.0) 05/05/2013    Past Medical History:  Diagnosis Date  . Arthritis    s/p B TKR  . Diabetes mellitus without complication ([place])   . Nephrolithiasis     Past Surgical History:  Procedure Laterality Date  . JOINT REPLACEMENT     s/p B TKR  . ROTATOR CUFF REPAIR     Bilateral.    Social History   Tobacco Use  . Smoking status: Never Smoker   . Smokeless tobacco: Former Systems developer    Types: Chew  Substance Use Topics  . Alcohol use: Yes    Alcohol/week: 6.0 standard drinks    Types: 6 Cans of beer per week  . Drug use: No    Family History  Problem Relation Age of Onset  . Alzheimer's disease Mother   . Heart disease Father 56       AMI    Allergies  Allergen Reactions  . Ace Inhibitors Swelling    angioedema  . Bee Venom   . Penicillins     Medication list has been reviewed and updated.  Current Outpatient Medications on File Prior to Visit  Medication Sig Dispense Refill  . aspirin 81 MG tablet Take 81 mg by mouth daily.    Marland Kitchen losartan (COZAAR) 100 MG tablet Take 50 mg by mouth daily.    . magnesium 30 MG tablet Take 30 mg by mouth 2 (two) times daily.    . naproxen (NAPROSYN) 500 MG tablet     . sertraline (ZOLOFT) 100 MG tablet     . tadalafil (CIALIS) 5 MG tablet Take 1 tablet (5 mg total) by mouth daily. For BPH and ED 30 tablet 11   No current facility-administered medications on file prior to visit.  Review of Systems:  As per HPI- otherwise negative.   Physical Examination: Vitals:   04/15/19 1407 04/15/19 1509  BP: (!) 142/88 124/80  Pulse: (!) 104   Resp: 18   Temp: 97.9 F (36.6 C)   SpO2: 98%    Vitals:   04/15/19 1407  Weight: 251 lb (113.9 kg)  Height: 5\' 7"  (1.702 m)   Body mass index is 39.31 kg/m. Ideal Body Weight: Weight in (lb) to have BMI = 25: 159.3  GEN: WDWN, NAD, Non-toxic, A & O x 3 HEENT: Atraumatic, Normocephalic. Neck supple. No masses, No LAD. Ears and Nose: No external deformity. CV: RRR, No M/G/R. No JVD. No thrill. No extra heart sounds. PULM: CTA B, no wheezes, crackles, rhonchi. No retractions. No resp. distress. No accessory muscle use. ABD: S, NT, ND, +BS. No rebound. No HSM. EXTR: No c/c/e NEURO Normal gait.  PSYCH: Normally interactive. Conversant. Not depressed or anxious appearing.  Calm demeanor.  Right TM is occluded with wax VC obtained-  warm water and peroxide used to irrigate ear but wax is very hard and difficult to remove.  Added colace and allowed ear to soak, then tried irrigation again -removed the majority of earwax and allowed a clear passageway to the tympanic membrane.  Patient felt that his hearing was improved, no pain, bleeding, complications  Assessment and Plan: Need for immunization against influenza  Dyslipidemia - Plan: Lipid panel  Diet-controlled diabetes mellitus ([place]) - Plan: Comprehensive metabolic panel, Hemoglobin A1c  Screening for deficiency anemia - Plan: CBC  Immunization due - Plan: Pneumococcal polysaccharide vaccine 23-valent greater than or equal to 2yo subcutaneous/IM, Flu Vaccine QUAD High Dose(Fluad)  Impacted cerumen of right ear  Essential hypertension  Here today for a follow-up visit- patient gets his care with [place] and also through the [place] Blood pressures under reasonable control on losartan 50.  He will monitor this at home Labs pending as above Immunizations updated He reports recent repeat PSA looks fine, we can return to normal screening at this time Will plan further follow- up pending labs.   Signed Lamar Blinks, MD  Received his labs 11/30 - letter to pt Results for orders placed or performed in visit on 04/15/19  CBC  Result Value Ref Range   WBC 9.2 4.0 - 10.5 K/uL   RBC 4.55 4.22 - 5.81 Mil/uL   Platelets 287.0 150.0 - 400.0 K/uL   Hemoglobin 14.0 13.0 - 17.0 g/dL   HCT 41.3 39.0 - 52.0 %   MCV 90.7 78.0 - 100.0 fl   MCHC 33.9 30.0 - 36.0 g/dL   RDW 13.5 11.5 - 15.5 %  Comprehensive metabolic panel  Result Value Ref Range   Sodium 138 135 - 145 mEq/L   Potassium 4.5 3.5 - 5.1 mEq/L   Chloride 100 96 - 112 mEq/L   CO2 30 19 - 32 mEq/L   Glucose, Bld 92 70 - 99 mg/dL   BUN 16 6 - 23 mg/dL   Creatinine, Ser 0.75 0.40 - 1.50 mg/dL   Total Bilirubin 0.9 0.2 - 1.2 mg/dL   Alkaline Phosphatase 70 39 - 117 U/L   AST 21 0 - 37 U/L   ALT 26 0 - 53 U/L    Total Protein 6.7 6.0 - 8.3 g/dL   Albumin 4.3 3.5 - 5.2 g/dL   Calcium 9.4 8.4 - 10.5 mg/dL   GFR 103.01 >60.00 mL/min  Hemoglobin A1c  Result Value Ref Range   Hgb A1c MFr Bld  6.2 4.6 - 6.5 %  Lipid panel  Result Value Ref Range   Cholesterol 238 (H) 0 - 200 mg/dL   Triglycerides 197.0 (H) 0.0 - 149.0 mg/dL   HDL 59.90 >39.00 mg/dL   VLDL 39.4 0.0 - 40.0 mg/dL   LDL Cholesterol 139 (H) 0 - 99 mg/dL   Total CHOL/HDL Ratio 4    NonHDL 178.50    The 10-year ASCVD risk score Mikey Bussing DC Jr., et al., 2013) is: 33%   Values used to calculate the score:     Age: 59 years     Sex: Male     Is Non-Hispanic African American: No     Diabetic: Yes     Tobacco smoker: No     Systolic Blood Pressure: [id number] mmHg     Is BP treated: Yes     HDL Cholesterol: 59.9 mg/dL     Total Cholesterol: 238 mg/dL

## 2019-04-10 NOTE — Patient Instructions (Addendum)
It was nice to see you again today, I will be in touch with your labs asap Your BP looks reasonable on your current dose of losartan.  Please try to keep an eye on this at home- goal is <140/90 or so  We go most of the wax out of your right ear today- let me know if any sign of infection develops such as pain or swelling

## 2019-04-12 ENCOUNTER — Other Ambulatory Visit: Payer: Self-pay

## 2019-04-15 ENCOUNTER — Other Ambulatory Visit: Payer: Self-pay

## 2019-04-15 ENCOUNTER — Encounter: Payer: Self-pay | Admitting: Family Medicine

## 2019-04-15 ENCOUNTER — Ambulatory Visit (INDEPENDENT_AMBULATORY_CARE_PROVIDER_SITE_OTHER): Payer: Medicare Other | Admitting: Family Medicine

## 2019-04-15 VITALS — BP 124/80 | HR 104 | Temp 97.9°F | Resp 18 | Ht 67.0 in | Wt 251.0 lb

## 2019-04-15 DIAGNOSIS — E119 Type 2 diabetes mellitus without complications: Secondary | ICD-10-CM | POA: Diagnosis not present

## 2019-04-15 DIAGNOSIS — Z23 Encounter for immunization: Secondary | ICD-10-CM | POA: Diagnosis not present

## 2019-04-15 DIAGNOSIS — E785 Hyperlipidemia, unspecified: Secondary | ICD-10-CM | POA: Diagnosis not present

## 2019-04-15 DIAGNOSIS — Z130 Encounter for screening for diseases of the blood and blood-forming organs and certain disorders involving the immune mechanism: Secondary | ICD-10-CM | POA: Diagnosis not present

## 2019-04-15 DIAGNOSIS — I10 Essential (primary) hypertension: Secondary | ICD-10-CM

## 2019-04-15 DIAGNOSIS — H6121 Impacted cerumen, right ear: Secondary | ICD-10-CM | POA: Diagnosis not present

## 2019-04-16 LAB — COMPREHENSIVE METABOLIC PANEL
ALT: 26 U/L (ref 0–53)
AST: 21 U/L (ref 0–37)
Albumin: 4.3 g/dL (ref 3.5–5.2)
Alkaline Phosphatase: 70 U/L (ref 39–117)
BUN: 16 mg/dL (ref 6–23)
CO2: 30 mEq/L (ref 19–32)
Calcium: 9.4 mg/dL (ref 8.4–10.5)
Chloride: 100 mEq/L (ref 96–112)
Creatinine, Ser: 0.75 mg/dL (ref 0.40–1.50)
GFR: 103.01 mL/min (ref >60.00)
Glucose, Bld: 92 mg/dL (ref 70–99)
Potassium: 4.5 mEq/L (ref 3.5–5.1)
Sodium: 138 mEq/L (ref 135–145)
Total Bilirubin: 0.9 mg/dL (ref 0.2–1.2)
Total Protein: 6.7 g/dL (ref 6.0–8.3)

## 2019-04-16 LAB — CBC
HCT: 41.3 % (ref 39.0–52.0)
Hemoglobin: 14 g/dL (ref 13.0–17.0)
MCHC: 33.9 g/dL (ref 30.0–36.0)
MCV: 90.7 fl (ref 78.0–100.0)
Platelets: 287 10*3/uL (ref 150.0–400.0)
RBC: 4.55 Mil/uL (ref 4.22–5.81)
RDW: 13.5 % (ref 11.5–15.5)
WBC: 9.2 10*3/uL (ref 4.0–10.5)

## 2019-04-16 LAB — HEMOGLOBIN A1C: Hgb A1c MFr Bld: 6.2 % (ref 4.6–6.5)

## 2019-04-16 LAB — LIPID PANEL
Cholesterol: 238 mg/dL — ABNORMAL HIGH (ref 0–200)
HDL: 59.9 mg/dL (ref >39.00)
LDL Cholesterol: 139 mg/dL — ABNORMAL HIGH (ref 0–99)
NonHDL: 178.5
Total CHOL/HDL Ratio: 4
Triglycerides: 197 mg/dL — ABNORMAL HIGH (ref 0.0–149.0)
VLDL: 39.4 mg/dL (ref 0.0–40.0)

## 2020-07-11 NOTE — Patient Instructions (Incomplete)
It was very nice to see you again today! Take care and I will be in touch with your labs asap  Please get your covid booster asap  Also consider getting your shingirx vaccine at pharmacy It sounds like you are having benign vertigo- please let me know if this does not clear up   We will get you set up with a Cologuard kit

## 2020-07-11 NOTE — Progress Notes (Addendum)
[place] at [facility] [address] [place], [place], [place] [address] [phone]  Date:  07/13/2020   Name:  Nathan Byrd   DOB:  July 14, 1950   MRN:  [id number]  PCP:  Darreld Mclean, MD    Chief Complaint: Dizziness (Lightheadedness, 3 weeks, hx of covid)   History of Present Illness:  Nathan Byrd is a 70 y.o. very pleasant male patient who presents with the following:  Patient seen today with concern of dizziness Last seen by myself November 2020 History of overweight, dyslipidemia, diet-controlled diabetes-he also gets some of his care at the [place]  He has urology follow-up for PSA screening Married to [place] He holds a CDL driver's license Most recent labs in my system from November 2020  He notes he has felt lightheaded when he stands up from sitting Lasts a few seconds Has noted for about a month  He does play golf once a week- no CP or SOB with exertion  BP Readings from Last 3 Encounters:  07/13/20 126/84  04/15/19 124/80  01/03/18 130/90    COVID-19 vaccine; he needs a booster  Foot exam- today  Eye exam- last year  Colon cancer screening now due- he would like to do cologuard as his last colon was negative  A1c is due Flu vaccine- give today   Patient Active Problem List   Diagnosis Date Noted  . Essential hypertension 04/15/2019  . Dyslipidemia 05/12/2016  . Diet-controlled diabetes mellitus ([place]) 08/27/2014  . Overweight and obesity(278.0) 05/05/2013    Past Medical History:  Diagnosis Date  . Arthritis    s/p B TKR  . Diabetes mellitus without complication ([place])   . Nephrolithiasis     Past Surgical History:  Procedure Laterality Date  . JOINT REPLACEMENT     s/p B TKR  . ROTATOR CUFF REPAIR     Bilateral.    Social History   Tobacco Use  . Smoking status: Never Smoker  . Smokeless tobacco: Former Systems developer    Types: Chew  Substance Use Topics  . Alcohol use: Yes    Alcohol/week: 6.0  standard drinks    Types: 6 Cans of beer per week  . Drug use: No    Family History  Problem Relation Age of Onset  . Alzheimer's disease Mother   . Heart disease Father 26       AMI    Allergies  Allergen Reactions  . Ace Inhibitors Swelling    angioedema  . Bee Venom   . Penicillins     Medication list has been reviewed and updated.  Current Outpatient Medications on File Prior to Visit  Medication Sig Dispense Refill  . aspirin 81 MG tablet Take 81 mg by mouth daily.    Marland Kitchen losartan (COZAAR) 100 MG tablet Take 50 mg by mouth daily.    . magnesium 30 MG tablet Take 30 mg by mouth 2 (two) times daily.    . naproxen (NAPROSYN) 500 MG tablet     . sertraline (ZOLOFT) 100 MG tablet     . tadalafil (CIALIS) 5 MG tablet Take 1 tablet (5 mg total) by mouth daily. For BPH and ED 30 tablet 11   No current facility-administered medications on file prior to visit.    Review of Systems:  As per HPI- otherwise negative.   Physical Examination: Vitals:   07/13/20 1113  BP: 126/84  Pulse: 84  Resp: 18  SpO2:  96%   Vitals:   07/13/20 1113  Weight: 250 lb (113.4 kg)  Height: 5\' 7"  (1.702 m)   Body mass index is 39.16 kg/m. Ideal Body Weight: Weight in (lb) to have BMI = 25: 159.3  GEN: no acute distress.  Obese, otherwise looks well  HEENT: Atraumatic, Normocephalic.  Ears and Nose: No external deformity. CV: RRR, No M/G/R. No JVD. No thrill. No extra heart sounds. PULM: CTA B, no wheezes, crackles, rhonchi. No retractions. No resp. distress. No accessory muscle use. ABD: S, NT, ND, +BS. No rebound. No HSM. EXTR: No c/c/e PSYCH: Normally interactive. Conversant. Pt mentions a non- tender fatty mass just inferior to the left breast which has been present for 15- 20 years per his report There is a flat, difficult to appreciate mobile mass just inferior to the medial left breast  Foot exam is normal   Assessment and Plan: Screening for colon cancer  Urinary  frequency - Plan: tadalafil (CIALIS) 5 MG tablet  Benign prostatic hyperplasia with incomplete bladder emptying - Plan: tadalafil (CIALIS) 5 MG tablet  Diet-controlled diabetes mellitus ([place]) - Plan: Comprehensive metabolic panel, Hemoglobin A1c  Screening for deficiency anemia - Plan: CBC  Essential hypertension - Plan: CBC, Comprehensive metabolic panel  Screening for prostate cancer - Plan: PSA  Dyslipidemia - Plan: Lipid panel  Needs flu shot - Plan: Flu Vaccine QUAD High Dose(Fluad)  Special screening for malignant neoplasms, colon - Plan: Cologuard  Breast mass  Order cologuard Labs pending as above BP under good control today  Flu vaccine given Encourage covid booster and shingrix  Will plan further follow- up pending labs. Will order an ultrasound/mammogram of the mass under left breast for patient   Arrange breast imaging near their home   This visit occurred during the SARS-CoV-2 public health emergency.  Safety protocols were in place, including screening questions prior to the visit, additional usage of staff PPE, and extensive cleaning of exam room while observing appropriate contact time as indicated for disinfecting solutions.    Signed Lamar Blinks, MD  Received his labs 2/1, patient does not have my chart, will have to call him Labs are reasonable except would like to start a statin The 10-year ASCVD risk score Mikey Bussing DC Brooke Bonito., et al., 2013) is: 38.1%   Values used to calculate the score:     Age: 86 years     Sex: Male     Is Non-Hispanic African American: No     Diabetic: Yes     Tobacco smoker: No     Systolic Blood Pressure: 161 mmHg     Is BP treated: Yes     HDL Cholesterol: 47.3 mg/dL     Total Cholesterol: 196 mg/dL   Found the following breast centers, will ask him which is most convenient [facility] [place], [place] [address]  [address] [place], [place]  [address]  [address], [place] [address]  Results for orders placed or performed in visit on 07/13/20  CBC  Result Value Ref Range   WBC 6.6 4.0 - 10.5 K/uL   RBC 4.50 4.22 - 5.81 Mil/uL   Platelets 270.0 150.0 - 400.0 K/uL   Hemoglobin 13.9 13.0 - 17.0 g/dL   HCT 41.1 39.0 - 52.0 %   MCV 91.2 78.0 - 100.0 fl   MCHC 33.8 30.0 - 36.0 g/dL   RDW 13.3 11.5 -  15.5 %  Comprehensive metabolic panel  Result Value Ref Range   Sodium 139 135 - 145 mEq/L   Potassium 4.6 3.5 - 5.1 mEq/L   Chloride 103 96 - 112 mEq/L   CO2 29 19 - 32 mEq/L   Glucose, Bld 102 (H) 70 - 99 mg/dL   BUN 16 6 - 23 mg/dL   Creatinine, Ser 0.75 0.40 - 1.50 mg/dL   Total Bilirubin 0.9 0.2 - 1.2 mg/dL   Alkaline Phosphatase 62 39 - 117 U/L   AST 23 0 - 37 U/L   ALT 33 0 - 53 U/L   Total Protein 7.0 6.0 - 8.3 g/dL   Albumin 4.3 3.5 - 5.2 g/dL   GFR 91.08 >60.00 mL/min   Calcium 9.8 8.4 - 10.5 mg/dL  Hemoglobin A1c  Result Value Ref Range   Hgb A1c MFr Bld 6.4 4.6 - 6.5 %  Lipid panel  Result Value Ref Range   Cholesterol 196 0 - 200 mg/dL   Triglycerides 112.0 0.0 - 149.0 mg/dL   HDL 47.30 >39.00 mg/dL   VLDL 22.4 0.0 - 40.0 mg/dL   LDL Cholesterol 126 (H) 0 - 99 mg/dL   Total CHOL/HDL Ratio 4    NonHDL 148.79   PSA  Result Value Ref Range   PSA 1.89 0.10 - 4.00 ng/mL   07/15/20- pt would like to use  [facility] [place], [place] [address]  Called in crestor 10- take 5-10 mg daily or every other day as tolerated  Called the [place] center diag mammo left with [place] left if needed [phone] fax  Scheduled for 08/17/20 at 11am- arrive at 10:30

## 2020-07-13 ENCOUNTER — Encounter: Payer: Self-pay | Admitting: Family Medicine

## 2020-07-13 ENCOUNTER — Ambulatory Visit (INDEPENDENT_AMBULATORY_CARE_PROVIDER_SITE_OTHER): Payer: Medicare Other | Admitting: Family Medicine

## 2020-07-13 ENCOUNTER — Other Ambulatory Visit: Payer: Self-pay

## 2020-07-13 VITALS — BP 126/84 | HR 84 | Resp 18 | Ht 67.0 in | Wt 250.0 lb

## 2020-07-13 DIAGNOSIS — E119 Type 2 diabetes mellitus without complications: Secondary | ICD-10-CM | POA: Diagnosis not present

## 2020-07-13 DIAGNOSIS — I10 Essential (primary) hypertension: Secondary | ICD-10-CM

## 2020-07-13 DIAGNOSIS — R3914 Feeling of incomplete bladder emptying: Secondary | ICD-10-CM

## 2020-07-13 DIAGNOSIS — Z125 Encounter for screening for malignant neoplasm of prostate: Secondary | ICD-10-CM

## 2020-07-13 DIAGNOSIS — Z23 Encounter for immunization: Secondary | ICD-10-CM

## 2020-07-13 DIAGNOSIS — N401 Enlarged prostate with lower urinary tract symptoms: Secondary | ICD-10-CM

## 2020-07-13 DIAGNOSIS — Z1211 Encounter for screening for malignant neoplasm of colon: Secondary | ICD-10-CM

## 2020-07-13 DIAGNOSIS — R350 Frequency of micturition: Secondary | ICD-10-CM | POA: Diagnosis not present

## 2020-07-13 DIAGNOSIS — Z130 Encounter for screening for diseases of the blood and blood-forming organs and certain disorders involving the immune mechanism: Secondary | ICD-10-CM | POA: Diagnosis not present

## 2020-07-13 DIAGNOSIS — E785 Hyperlipidemia, unspecified: Secondary | ICD-10-CM

## 2020-07-13 DIAGNOSIS — N63 Unspecified lump in unspecified breast: Secondary | ICD-10-CM

## 2020-07-13 LAB — PSA: PSA: 1.89 ng/mL (ref 0.10–4.00)

## 2020-07-13 LAB — COMPREHENSIVE METABOLIC PANEL
ALT: 33 U/L (ref 0–53)
AST: 23 U/L (ref 0–37)
Albumin: 4.3 g/dL (ref 3.5–5.2)
Alkaline Phosphatase: 62 U/L (ref 39–117)
BUN: 16 mg/dL (ref 6–23)
CO2: 29 mEq/L (ref 19–32)
Calcium: 9.8 mg/dL (ref 8.4–10.5)
Chloride: 103 mEq/L (ref 96–112)
Creatinine, Ser: 0.75 mg/dL (ref 0.40–1.50)
GFR: 91.08 mL/min (ref >60.00)
Glucose, Bld: 102 mg/dL — ABNORMAL HIGH (ref 70–99)
Potassium: 4.6 mEq/L (ref 3.5–5.1)
Sodium: 139 mEq/L (ref 135–145)
Total Bilirubin: 0.9 mg/dL (ref 0.2–1.2)
Total Protein: 7 g/dL (ref 6.0–8.3)

## 2020-07-13 LAB — CBC
HCT: 41.1 % (ref 39.0–52.0)
Hemoglobin: 13.9 g/dL (ref 13.0–17.0)
MCHC: 33.8 g/dL (ref 30.0–36.0)
MCV: 91.2 fl (ref 78.0–100.0)
Platelets: 270 10*3/uL (ref 150.0–400.0)
RBC: 4.5 Mil/uL (ref 4.22–5.81)
RDW: 13.3 % (ref 11.5–15.5)
WBC: 6.6 10*3/uL (ref 4.0–10.5)

## 2020-07-13 LAB — HEMOGLOBIN A1C: Hgb A1c MFr Bld: 6.4 % (ref 4.6–6.5)

## 2020-07-13 LAB — LIPID PANEL
Cholesterol: 196 mg/dL (ref 0–200)
HDL: 47.3 mg/dL (ref >39.00)
LDL Cholesterol: 126 mg/dL — ABNORMAL HIGH (ref 0–99)
NonHDL: 148.79
Total CHOL/HDL Ratio: 4
Triglycerides: 112 mg/dL (ref 0.0–149.0)
VLDL: 22.4 mg/dL (ref 0.0–40.0)

## 2020-07-13 MED ORDER — TADALAFIL 5 MG PO TABS: 5.0000 mg | ORAL_TABLET | Freq: Every day | ORAL | 11 refills | Status: DC

## 2020-07-15 MED ORDER — ROSUVASTATIN CALCIUM 10 MG PO TABS: 5.0000 mg | ORAL_TABLET | Freq: Every day | ORAL | 3 refills | Status: DC

## 2020-07-15 NOTE — Addendum Note (Signed)
Addended by: Lamar Blinks C on: 07/15/2020 11:24 AM   Modules accepted: Orders

## 2020-07-29 DIAGNOSIS — Z1211 Encounter for screening for malignant neoplasm of colon: Secondary | ICD-10-CM | POA: Diagnosis not present

## 2020-08-06 ENCOUNTER — Encounter: Payer: Self-pay | Admitting: Family Medicine

## 2020-08-06 LAB — EXTERNAL GENERIC LAB PROCEDURE: COLOGUARD: NEGATIVE

## 2020-08-06 LAB — COLOGUARD
COLOGUARD: NEGATIVE
Cologuard: NEGATIVE

## 2020-08-06 NOTE — Addendum Note (Signed)
Addended by: Wynonia Musty A on: 08/06/2020 10:22 AM   Modules accepted: Orders

## 2020-08-14 ENCOUNTER — Telehealth: Payer: Medicare Other | Admitting: Family Medicine

## 2020-08-14 NOTE — Telephone Encounter (Signed)
Called pt- we sent order on paper a month ago, he was not aware of this appt on Monday and they will actually be in [place],  [place] that I try to schedule at [facility] breast center if we can.  I will call them when open- pt asks me to call his wife Arrie Aran as she is more apt to answer the phone which is fine

## 2020-08-14 NOTE — Telephone Encounter (Signed)
Caller: Ronny Bacon ([place] [facility]) [facility] Call back # [phone] Fax # [phone]  Need an order for Diagnostic mammogram & Left breast ultrasound.  Patient appt is on 08/17/20

## 2020-08-14 NOTE — Telephone Encounter (Signed)
I pended the mammogram I wanted ot make sure it was the right order before signing.   Also could you verify on ultrasound? I wasn't sure which one to place.

## 2020-08-17 NOTE — Telephone Encounter (Signed)
They would like to have this appt done at [facility] at the following location  I called him, they do not appear to have received the fax order I sent on February 2.  We will refax this, then they will call patient to schedule.  Asked him to communicate through patient's wife Dawn per his preference-he often does not answer his phone  Address: [address] [place], [place] [address] Confirmed by this business 2 weeks ago Phone: [phone]

## 2020-08-24 ENCOUNTER — Telehealth: Payer: Self-pay | Admitting: Family Medicine

## 2020-08-24 DIAGNOSIS — N63 Unspecified lump in unspecified breast: Secondary | ICD-10-CM

## 2020-08-24 NOTE — Telephone Encounter (Signed)
Call [name] from [facility]   Patient needs an order for a bilateral diagnoses mammograph  Fax order to [phone]

## 2020-08-25 NOTE — Telephone Encounter (Signed)
Printed and faxed order to site listed.

## 2020-09-18 NOTE — Telephone Encounter (Signed)
Caller Arbie Cookey  Call Back @ [phone]  Per Arbie Cookey @ [facility], she would like an order updated, Please fax an updated  order for bilateral diagnostic mammogram to [phone]

## 2020-09-22 ENCOUNTER — Encounter: Payer: Self-pay | Admitting: Family Medicine

## 2020-09-22 NOTE — Telephone Encounter (Signed)
Letter faxed with new order.

## 2020-10-12 ENCOUNTER — Telehealth: Payer: Self-pay | Admitting: Family Medicine

## 2020-10-12 DIAGNOSIS — N63 Unspecified lump in unspecified breast: Secondary | ICD-10-CM

## 2020-10-12 DIAGNOSIS — R350 Frequency of micturition: Secondary | ICD-10-CM

## 2020-10-12 NOTE — Telephone Encounter (Signed)
Caller [name] from [facility] in [place] # [phone]  They are requesting a bilateral diagnos mammogram an ultrasound as needed   Please fax to [phone]

## 2020-10-12 NOTE — Telephone Encounter (Signed)
I have signed and faxed order to breast center.

## 2020-10-19 DIAGNOSIS — N6320 Unspecified lump in the left breast, unspecified quadrant: Secondary | ICD-10-CM | POA: Diagnosis not present

## 2020-10-19 DIAGNOSIS — Z1231 Encounter for screening mammogram for malignant neoplasm of breast: Secondary | ICD-10-CM | POA: Diagnosis not present

## 2020-10-19 NOTE — Telephone Encounter (Signed)
Spoke with breast center I have faxed and emailed order per their request.

## 2020-10-19 NOTE — Telephone Encounter (Signed)
[name] calling back stating they need an ultrasound order to be added if need it. Need order today before noon.   Fax to [phone]

## 2020-10-20 ENCOUNTER — Telehealth: Payer: Self-pay | Admitting: Family Medicine

## 2020-10-20 NOTE — Telephone Encounter (Signed)
Called pt as I got another fax from the [facility] in [facility] regarding his imaging.  The fax stated they needed the ordered changed to bilateral imaging?  Pt states he did have his imaging done tomorrow so I think all is taken care of. Will watch for report

## 2021-11-21 DIAGNOSIS — H6693 Otitis media, unspecified, bilateral: Secondary | ICD-10-CM | POA: Diagnosis not present

## 2021-11-21 DIAGNOSIS — Z720 Tobacco use: Secondary | ICD-10-CM | POA: Diagnosis not present

## 2021-11-21 DIAGNOSIS — J019 Acute sinusitis, unspecified: Secondary | ICD-10-CM | POA: Diagnosis not present

## 2022-04-25 ENCOUNTER — Telehealth: Payer: Self-pay | Admitting: *Deleted

## 2022-04-25 NOTE — Telephone Encounter (Signed)
LMOM for pt to return call to schedule AWV.

## 2022-08-04 DIAGNOSIS — L821 Other seborrheic keratosis: Secondary | ICD-10-CM | POA: Diagnosis not present

## 2022-08-04 DIAGNOSIS — L814 Other melanin hyperpigmentation: Secondary | ICD-10-CM | POA: Diagnosis not present

## 2022-10-19 DIAGNOSIS — M25512 Pain in left shoulder: Secondary | ICD-10-CM | POA: Diagnosis not present

## 2023-02-03 ENCOUNTER — Telehealth: Payer: Self-pay | Admitting: Family Medicine

## 2023-02-03 NOTE — Telephone Encounter (Signed)
Patient last seen in 2022- patient will call back to schedule annual physical

## 2023-02-14 NOTE — Patient Instructions (Signed)
It was good to see you again today  I will be in touch with your labs  Recommend shingles vaccine series at your pharmacy, flu shot and COVID booster this fall

## 2023-02-14 NOTE — Progress Notes (Addendum)
[facility] [address], [address] [place], [place] [address] [phone]  Date:  02/16/2023   Name:  Nathan Byrd   DOB:  January 24, 1951   MRN:  [id number]  PCP:  Pearline Cables, MD    Chief Complaint: Annual Exam (Concerns/ questions: 1. some fatigue. 2. L ankle injury yesterday  02/15/23/AWV due/Shingrix due- Medicare pr/Flu shot today: yes)   History of Present Illness:  Nathan Byrd is a 72 y.o. 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 No CP or SOB  Married to [employer]- he did twist his ankle 2 days ago He is able to walk but it does hurt   Needs lab update- will do today  Eye exam- he is coming up due next month  Foot exam Shingles vaccine Tetanus, flu, COVID Cologuard can be updated next year  He gets his meds through the [place]  Asa Losartan Sertraline Crestor Cialis  Patient Active Problem List   Diagnosis Date Noted   Essential hypertension 04/15/2019   Dyslipidemia 05/12/2016   Diet-controlled diabetes mellitus (HCC) 08/27/2014   Overweight and obesity(278.0) 05/05/2013    Past Medical History:  Diagnosis Date   Arthritis    s/p B TKR   Diabetes mellitus without complication (HCC)    Nephrolithiasis     Past Surgical History:  Procedure Laterality Date   JOINT REPLACEMENT     s/p B TKR   ROTATOR CUFF REPAIR     Bilateral.    Social History   Tobacco Use   Smoking status: Never   Smokeless tobacco: Former    Types: Chew  Substance Use Topics   Alcohol use: Yes    Alcohol/week: 6.0 standard drinks of alcohol    Types: 6 Cans of beer per week   Drug use: No    Family History  Problem Relation Age of Onset    Alzheimer's disease Mother    Heart disease Father 76       AMI    Allergies  Allergen Reactions   Ace Inhibitors Swelling    angioedema   Amlodipine     Other Reaction(s): ANGIOEDEMA OF LIPS   Atorvastatin     Other Reaction(s): Muscle pain   Bee Venom    Lisinopril    Lovastatin     Other Reaction(s): Muscle pain   Penicillins    Rosuvastatin     Other Reaction(s): Muscle pain   Simvastatin     Other Reaction(s): Muscle pain    Medication list has been reviewed and updated.  Current Outpatient Medications on File Prior to Visit  Medication Sig Dispense Refill   aspirin 81 MG tablet Take 81 mg by mouth daily.     losartan (COZAAR) 100 MG tablet Take 50 mg by mouth daily.     magnesium 30 MG tablet Take 30 mg by mouth 2 (two) times daily.     naproxen (NAPROSYN) 500 MG tablet      rosuvastatin (CRESTOR) 10 MG tablet Take 0.5-1 tablets (5-10 mg total) by mouth daily. Take as tolerated. May take every other day or less frequently if needed 90 tablet 3   sertraline (ZOLOFT) 100 MG  tablet      tadalafil (CIALIS) 5 MG tablet Take 1 tablet (5 mg total) by mouth daily. For BPH and ED 30 tablet 11   No current facility-administered medications on file prior to visit.    Review of Systems:  As per HPI- otherwise negative.   Physical Examination: Vitals:   02/16/23 1306  BP: 132/80  Pulse: 81  Resp: 18  Temp: 97.8 F (36.6 C)  SpO2: 99%   Vitals:   02/16/23 1306  Weight: 224 lb 3.2 oz (101.7 kg)  Height: 5\' 4"  (1.626 m)   Body mass index is 38.48 kg/m. Ideal Body Weight: Weight in (lb) to have BMI = 25: 145.3  GEN: no acute distress. Obese, looks well  HEENT: Atraumatic, Normocephalic.  Bilateral TM wnl, oropharynx normal.  PEERL,EOMI.   Ears and Nose: No external deformity. CV: RRR, No M/G/R. No JVD. No thrill. No extra heart sounds. PULM: CTA B, no wheezes, crackles, rhonchi. No retractions. No resp. distress. No accessory muscle use. ABD: S, NT, ND. No  rebound. No HSM. EXTR: No c/c/e PSYCH: Normally interactive. Conversant.  Left ankle- mild puffiness over the lateral ankle only, no bruising  Foot exam normal bilaterally  Small wound on left wrist - does not need wound care but will update tetanus  Assessment and Plan: Physical exam - Plan: Flu Vaccine Trivalent High Dose (Fluad)  Diet-controlled diabetes mellitus (HCC) - Plan: Comprehensive metabolic panel, Hemoglobin A1c, Microalbumin / creatinine urine ratio  Dyslipidemia - Plan: Lipid panel, CT CARDIAC SCORING (SELF PAY ONLY)  Essential hypertension - Plan: CBC, Comprehensive metabolic panel, CT CARDIAC SCORING (SELF PAY ONLY)  Special screening, prostate cancer - Plan: PSA  Need for influenza vaccination - Plan: Flu Vaccine Trivalent High Dose (Fluad)  Open wound of left forearm, initial encounter - Plan: Td vaccine greater than or equal to 7yo preservative free IM  Physical exam today.  Encouraged healthy diet and exercise routine Will plan further follow- up pending labs. Discussed CT coronary and he would like to do this  He will keep an eye on his ankle, let me know if not improving  Signed Abbe Amsterdam, MD  Received labs as below, 9/6.  Letter to patient as he does not have MyChart  Results for orders placed or performed in visit on 02/16/23  CBC  Result Value Ref Range   WBC 9.9 4.0 - 10.5 K/uL   RBC 4.57 4.22 - 5.81 Mil/uL   Platelets 290.0 150.0 - 400.0 K/uL   Hemoglobin 14.4 13.0 - 17.0 g/dL   HCT 40.9 81.1 - 91.4 %   MCV 94.2 78.0 - 100.0 fl   MCHC 33.4 30.0 - 36.0 g/dL   RDW 78.2 95.6 - 21.3 %  Comprehensive metabolic panel  Result Value Ref Range   Sodium 141 135 - 145 mEq/L   Potassium 4.5 3.5 - 5.1 mEq/L   Chloride 102 96 - 112 mEq/L   CO2 32 19 - 32 mEq/L   Glucose, Bld 145 (H) 70 - 99 mg/dL   BUN 13 6 - 23 mg/dL   Creatinine, Ser 0.86 0.40 - 1.50 mg/dL   Total Bilirubin 1.6 (H) 0.2 - 1.2 mg/dL   Alkaline Phosphatase 73 39 - 117 U/L   AST  16 0 - 37 U/L   ALT 15 0 - 53 U/L   Total Protein 6.7 6.0 - 8.3 g/dL   Albumin 3.9 3.5 - 5.2 g/dL   GFR 57.84 >69.62 mL/min   Calcium 9.4  8.4 - 10.5 mg/dL  Hemoglobin Z6X  Result Value Ref Range   Hgb A1c MFr Bld 5.9 4.6 - 6.5 %  Lipid panel  Result Value Ref Range   Cholesterol 204 (H) 0 - 200 mg/dL   Triglycerides 096.0 0.0 - 149.0 mg/dL   HDL 45.40 >98.11 mg/dL   VLDL 91.4 0.0 - 78.2 mg/dL   LDL Cholesterol 956 (H) 0 - 99 mg/dL   Total CHOL/HDL Ratio 4    NonHDL 151.40   PSA  Result Value Ref Range   PSA 2.31 0.10 - 4.00 ng/mL

## 2023-02-16 ENCOUNTER — Ambulatory Visit (INDEPENDENT_AMBULATORY_CARE_PROVIDER_SITE_OTHER): Payer: Medicare Other | Admitting: Family Medicine

## 2023-02-16 VITALS — BP 132/80 | HR 81 | Temp 97.8°F | Resp 18 | Ht 64.0 in | Wt 224.2 lb

## 2023-02-16 DIAGNOSIS — Z0001 Encounter for general adult medical examination with abnormal findings: Secondary | ICD-10-CM | POA: Diagnosis not present

## 2023-02-16 DIAGNOSIS — S51802A Unspecified open wound of left forearm, initial encounter: Secondary | ICD-10-CM | POA: Diagnosis not present

## 2023-02-16 DIAGNOSIS — E785 Hyperlipidemia, unspecified: Secondary | ICD-10-CM | POA: Diagnosis not present

## 2023-02-16 DIAGNOSIS — E119 Type 2 diabetes mellitus without complications: Secondary | ICD-10-CM

## 2023-02-16 DIAGNOSIS — Z23 Encounter for immunization: Secondary | ICD-10-CM

## 2023-02-16 DIAGNOSIS — Z125 Encounter for screening for malignant neoplasm of prostate: Secondary | ICD-10-CM | POA: Diagnosis not present

## 2023-02-16 DIAGNOSIS — I10 Essential (primary) hypertension: Secondary | ICD-10-CM

## 2023-02-16 DIAGNOSIS — Z0000 Encounter for general adult medical examination without abnormal findings: Secondary | ICD-10-CM

## 2023-02-17 DIAGNOSIS — X509XXA Other and unspecified overexertion or strenuous movements or postures, initial encounter: Secondary | ICD-10-CM | POA: Diagnosis not present

## 2023-02-17 DIAGNOSIS — S93402A Sprain of unspecified ligament of left ankle, initial encounter: Secondary | ICD-10-CM | POA: Diagnosis not present

## 2023-02-17 DIAGNOSIS — M25572 Pain in left ankle and joints of left foot: Secondary | ICD-10-CM | POA: Diagnosis not present

## 2023-02-17 DIAGNOSIS — M7989 Other specified soft tissue disorders: Secondary | ICD-10-CM | POA: Diagnosis not present

## 2023-02-17 LAB — COMPREHENSIVE METABOLIC PANEL
ALT: 15 U/L (ref 0–53)
AST: 16 U/L (ref 0–37)
Albumin: 3.9 g/dL (ref 3.5–5.2)
Alkaline Phosphatase: 73 U/L (ref 39–117)
BUN: 13 mg/dL (ref 6–23)
CO2: 32 meq/L (ref 19–32)
Calcium: 9.4 mg/dL (ref 8.4–10.5)
Chloride: 102 meq/L (ref 96–112)
Creatinine, Ser: 0.79 mg/dL (ref 0.40–1.50)
GFR: 88.04 mL/min (ref >60.00)
Glucose, Bld: 145 mg/dL — ABNORMAL HIGH (ref 70–99)
Potassium: 4.5 meq/L (ref 3.5–5.1)
Sodium: 141 meq/L (ref 135–145)
Total Bilirubin: 1.6 mg/dL — ABNORMAL HIGH (ref 0.2–1.2)
Total Protein: 6.7 g/dL (ref 6.0–8.3)

## 2023-02-17 LAB — HEMOGLOBIN A1C: Hgb A1c MFr Bld: 5.9 % (ref 4.6–6.5)

## 2023-02-17 LAB — LIPID PANEL
Cholesterol: 204 mg/dL — ABNORMAL HIGH (ref 0–200)
HDL: 52.8 mg/dL (ref >39.00)
LDL Cholesterol: 124 mg/dL — ABNORMAL HIGH (ref 0–99)
NonHDL: 151.4
Total CHOL/HDL Ratio: 4
Triglycerides: 139 mg/dL (ref 0.0–149.0)
VLDL: 27.8 mg/dL (ref 0.0–40.0)

## 2023-02-17 LAB — CBC
HCT: 43 % (ref 39.0–52.0)
Hemoglobin: 14.4 g/dL (ref 13.0–17.0)
MCHC: 33.4 g/dL (ref 30.0–36.0)
MCV: 94.2 fl (ref 78.0–100.0)
Platelets: 290 10*3/uL (ref 150.0–400.0)
RBC: 4.57 Mil/uL (ref 4.22–5.81)
RDW: 13.6 % (ref 11.5–15.5)
WBC: 9.9 10*3/uL (ref 4.0–10.5)

## 2023-02-17 LAB — PSA: PSA: 2.31 ng/mL (ref 0.10–4.00)

## 2023-02-17 NOTE — Addendum Note (Signed)
Addended by: Abbe Amsterdam C on: 02/17/2023 08:55 PM   Modules accepted: Orders

## 2023-02-22 ENCOUNTER — Telehealth: Payer: Self-pay | Admitting: *Deleted

## 2023-02-22 NOTE — Addendum Note (Signed)
Addended by: Mervin Kung A on: 02/22/2023 10:31 AM   Modules accepted: Orders

## 2023-02-22 NOTE — Telephone Encounter (Signed)
Pt needs lab appointment to complete fractionated bilirubin per letter from PCP on 9/6.  We will also need to collect urine for MALB that was due at visit on 02/16/23.  Left message for pt to return my call.

## 2023-02-24 ENCOUNTER — Encounter: Payer: Self-pay | Admitting: Family Medicine

## 2023-03-03 ENCOUNTER — Ambulatory Visit (HOSPITAL_BASED_OUTPATIENT_CLINIC_OR_DEPARTMENT_OTHER)
Admission: RE | Admit: 2023-03-03 | Discharge: 2023-03-03 | Disposition: A | Discharge status: Home / Self Care | Payer: Medicare Other | Source: Ambulatory Visit | Attending: Family Medicine | Admitting: Family Medicine

## 2023-03-03 ENCOUNTER — Encounter (HOSPITAL_BASED_OUTPATIENT_CLINIC_OR_DEPARTMENT_OTHER): Payer: Self-pay

## 2023-03-03 DIAGNOSIS — E785 Hyperlipidemia, unspecified: Secondary | ICD-10-CM | POA: Insufficient documentation

## 2023-03-03 DIAGNOSIS — I10 Essential (primary) hypertension: Secondary | ICD-10-CM | POA: Insufficient documentation

## 2023-03-03 LAB — HM DIABETES EYE EXAM

## 2023-03-06 ENCOUNTER — Encounter: Payer: Self-pay | Admitting: Family Medicine

## 2023-03-06 DIAGNOSIS — R931 Abnormal findings on diagnostic imaging of heart and coronary circulation: Secondary | ICD-10-CM

## 2023-03-06 DIAGNOSIS — E119 Type 2 diabetes mellitus without complications: Secondary | ICD-10-CM

## 2023-03-06 DIAGNOSIS — N529 Male erectile dysfunction, unspecified: Secondary | ICD-10-CM

## 2023-03-21 ENCOUNTER — Ambulatory Visit: Payer: Medicare Other | Attending: Cardiology | Admitting: Cardiology

## 2023-03-21 ENCOUNTER — Encounter: Payer: Self-pay | Admitting: Cardiology

## 2023-03-21 VITALS — BP 132/82 | HR 95 | Resp 16 | Ht 64.0 in | Wt 224.8 lb

## 2023-03-21 DIAGNOSIS — E7800 Pure hypercholesterolemia, unspecified: Secondary | ICD-10-CM

## 2023-03-21 DIAGNOSIS — I10 Essential (primary) hypertension: Secondary | ICD-10-CM | POA: Diagnosis not present

## 2023-03-21 DIAGNOSIS — I2510 Atherosclerotic heart disease of native coronary artery without angina pectoris: Secondary | ICD-10-CM | POA: Diagnosis not present

## 2023-03-21 DIAGNOSIS — I7121 Aneurysm of the ascending aorta, without rupture: Secondary | ICD-10-CM | POA: Diagnosis not present

## 2023-03-21 DIAGNOSIS — R931 Abnormal findings on diagnostic imaging of heart and coronary circulation: Secondary | ICD-10-CM | POA: Diagnosis not present

## 2023-03-21 DIAGNOSIS — I2584 Coronary atherosclerosis due to calcified coronary lesion: Secondary | ICD-10-CM

## 2023-03-21 DIAGNOSIS — E559 Vitamin D deficiency, unspecified: Secondary | ICD-10-CM

## 2023-03-21 MED ORDER — EZETIMIBE-SIMVASTATIN 10-10 MG PO TABS: 1.0000 | ORAL_TABLET | Freq: Every day | ORAL | 2 refills | Status: AC

## 2023-03-21 NOTE — Patient Instructions (Addendum)
Medication Instructions:  Your physician recommends that you continue on your current medications as directed. Please refer to the Current Medication list given to you today.  *If you need a refill on your cardiac medications before your next appointment, please call your pharmacy*  Lab Work: Your physician recommends that you have lab work today. Vitamin D  Your physician recommends that you return for lab work in: 6 weeks for fasting lipid panel.  If you have labs (blood work) drawn today and your tests are completely normal, you will receive your results only by: MyChart Message (if you have MyChart) OR A paper copy in the mail If you have any lab test that is abnormal or we need to change your treatment, we will call you to review the results.  Testing/Procedures: Your physician has requested that you have an echocardiogram. Echocardiography is a painless test that uses sound waves to create images of your heart. It provides your doctor with information about the size and shape of your heart and how well your heart's chambers and valves are working. This procedure takes approximately one hour. There are no restrictions for this procedure. Please do NOT wear cologne, perfume, aftershave, or lotions (deodorant is allowed). Please arrive 15 minutes prior to your appointment time.  Your physician has requested that you have en exercise stress myoview. For further information please visit [email or web address] Please follow instruction sheet, as given.   Follow-Up: At [facility], you and your health needs are our priority.  As part of our continuing mission to provide you with exceptional heart care, we have created designated Provider Care Teams.  These Care Teams include your primary Cardiologist (physician) and Advanced Practice Providers (APPs -  Physician Assistants and Nurse Practitioners) who all work together to provide you with the care you need, when you need it.  We  recommend signing up for the patient portal called "MyChart".  Sign up information is provided on this After Visit Summary.  MyChart is used to connect with patients for Virtual Visits (Telemedicine).  Patients are able to view lab/test results, encounter notes, upcoming appointments, etc.  Non-urgent messages can be sent to your provider as well.   To learn more about what you can do with MyChart, go to [email or web address].    Your next appointment:   2 months  Provider:   Yates Decamp, MD

## 2023-03-21 NOTE — Progress Notes (Signed)
Cardiology Office Note:  .   Date:  03/21/2023  ID:  Nathan Byrd, DOB 09-Apr-1951, MRN [id number] PCP: Pearline Cables, MD  [facility] Providers Cardiologist:  Yates Decamp, MD    History of Present Illness: .   Nathan Byrd is a 72 y.o.   Discussed the use of AI scribe software for clinical note transcription with the patient, who gave verbal consent to proceed.  History of Present Illness   Mr. Nathan Byrd, a patient with a history of tobacco use and prediabetes, presents for a check-up following his brother's recent stent placement and his own abnormal calcium score. He reports being very active, mowing fifteen yards, running errands, and playing golf. However, his wife clarifies that his physical activity is not as strenuous as he perceives, consisting mostly of small yard work tasks and golfing with a cart. The patient admits to occasional dizziness, but does not specify the circumstances under which this occurs. He also reports frequent napping, often falling asleep shortly after sitting down, and his wife notes that he snores loudly at night. The patient's wife also mentions that he has a tendency to fall asleep inappropriately, such as while watching TV. The patient does not report any chest pain or shortness of breath.     Review of Systems  Cardiovascular:  Negative for chest pain, dyspnea on exertion and leg swelling.  Respiratory:  Positive for snoring.    Risk Assessment/Calculations:    Lab Results  Component Value Date   CHOL 204 (H) 02/16/2023   HDL 52.80 02/16/2023   LDLCALC 124 (H) 02/16/2023   TRIG 139.0 02/16/2023   CHOLHDL 4 02/16/2023   Lab Results  Component Value Date   NA 141 02/16/2023   K 4.5 02/16/2023   CO2 32 02/16/2023   GLUCOSE 145 (H) 02/16/2023   BUN 13 02/16/2023   CREATININE 0.79 02/16/2023   CALCIUM 9.4 02/16/2023   GFR 88.04 02/16/2023   GFRNONAA >60 03/15/2008   Lab Results  Component Value Date   WBC 9.9 02/16/2023   HGB 14.4  02/16/2023   HCT 43.0 02/16/2023   MCV 94.2 02/16/2023   PLT 290.0 02/16/2023   Lab Results  Component Value Date   HGBA1C 5.9 02/16/2023   Lab Results  Component Value Date   TSH 1.323 05/05/2013    Physical Exam:   VS:  BP 132/82 (BP Location: Left Arm, Patient Position: Sitting, Cuff Size: Large)   Pulse 95   Resp 16   Ht 5\' 4"  (1.626 m)   Wt 224 lb 12.8 oz (102 kg)   SpO2 97%   BMI 38.59 kg/m    Wt Readings from Last 3 Encounters:  03/21/23 224 lb 12.8 oz (102 kg)  02/16/23 224 lb 3.2 oz (101.7 kg)  07/13/20 250 lb (113.4 kg)     Physical Exam Neck:     Vascular: No carotid bruit or JVD.  Cardiovascular:     Rate and Rhythm: Normal rate and regular rhythm.     Pulses: Normal pulses and intact distal pulses.     Heart sounds: S1 normal and S2 normal. Murmur heard.     Early systolic murmur is present with a grade of 2/6 at the upper right sternal border.     No gallop.  Pulmonary:     Effort: Pulmonary effort is normal.     Breath sounds: Normal breath sounds.  Abdominal:     General: Bowel sounds are normal.  Palpations: Abdomen is soft.  Musculoskeletal:     Right lower leg: No edema.     Left lower leg: No edema.     Studies Reviewed: Marland Kitchen    EKG:    EKG Interpretation Date/Time:  Tuesday March 21 2023 15:49:28 EDT Ventricular Rate:  81 PR Interval:  140 QRS Duration:  90 QT Interval:  372 QTC Calculation: 432 R Axis:   13  Text Interpretation: EKG 03/21/2023: Normal sinus rhythm at rate of 81 bpm, normal axis, no evidence of ischemia, normal EKG.  No significant change from 05/09/2001. Confirmed by Delrae Rend [phone]) on 03/21/2023 4:01:50 PM    Coronary calcium score 03/03/2023: Coronary calcium score of 8103. This was 99th percentile for age-, race-, and sex-matched controls. LM 0 LAD 1646 LCx 1684 RCA 4773. Ascending aorta mildly dilated at 4.1 cm.   ASSESSMENT AND PLAN: .      ICD-10-CM   1. Elevated coronary artery calcium  score  R93.1     2. Coronary artery disease due to calcified coronary lesion  I25.10 EKG 12-Lead   I25.84 ECHOCARDIOGRAM COMPLETE    MYOCARDIAL PERFUSION IMAGING    Lipid panel    3. Essential hypertension  I10     4. Aneurysm of ascending aorta without rupture (HCC)  I71.21 ECHOCARDIOGRAM COMPLETE    MYOCARDIAL PERFUSION IMAGING    Lipid panel    5. Hypercholesteremia  E78.00 Lipid panel    6. Hypovitaminosis D  E55.9 VITAMIN D 25 Hydroxy (Vit-D Deficiency, Fractures)    VITAMIN D 25 Hydroxy (Vit-D Deficiency, Fractures)      Assessment and Plan 1. Elevated coronary artery calcium score Patient has severe elevation in coronary calcium score in the 99 percentile, he probably has significant coronary artery disease although he denies any specific symptoms of chest pain or dyspnea.  However on questioning, although he states he is active, his physical activity is limited.  I will set him up for exercise nuclear stress test.  - EKG 12-Lead - VITAMIN D 25 Hydroxy (Vit-D Deficiency, Fractures)  2. Essential hypertension Blood pressure is well-controlled on losartan, I would like to follow-up on his stress test to see blood pressure response, goal blood pressure would be <130/80 mmHg.  Depending upon the stress test, I may add additional medications and also will follow-up on his blood pressure on his next office visit.  Weight loss and avoidance of excess salt and DASH diet discussed with the patient and his wife at the bedside.  3. Aneurysm of ascending aorta without rupture (HCC) Will obtain an echocardiogram to evaluate ascending aortic aneurysm as well to correlate radiologically noted ascending aortic aneurysm.  Patient does chew tobacco, strictly advised him to be abstinent from tobacco use.  In view of this and presence of ascending aortic aneurysm, he will need abdominal aortic duplex for screening for AAA, will request Dr. Warner Mccreedy to place orders.  4.  Hypercholesteremia Patient has intolerance to multiple statins, however do not see vitamin D level checked, I will check vitamin D level today and I will go ahead and start him on Vytorin 10/10 mg daily.  Patient is willing to try this.  Will obtain lipid profile testing in 6 to 8 weeks prior to his next office visit. - VITAMIN D 25 Hydroxy (Vit-D Deficiency, Fractures)  5. Hypovitaminosis D As dictated above. - VITAMIN D 25 Hydroxy (Vit-D Deficiency, Fractures)  Other orders - ezetimibe-simvastatin (VYTORIN) 10-10 MG tablet; Take 1 tablet by mouth at bedtime.  Dispense: 30 tablet; Refill: 2     Informed Consent   Shared Decision Making/Informed Consent The risks [chest pain, shortness of breath, cardiac arrhythmias, dizziness, blood pressure fluctuations, myocardial infarction, stroke/transient ischemic attack, nausea, vomiting, allergic reaction, radiation exposure, metallic taste sensation and life-threatening complications (estimated to be 1 in 10,000)], benefits (risk stratification, diagnosing coronary artery disease, treatment guidance) and alternatives of a nuclear stress test were discussed in detail with Mr. Colver and he agrees to proceed.     Dispo: OV in 2 months with repeat lipids  Signed,  Yates Decamp, MD, [facility] 03/21/2023, 6:03 PM

## 2023-03-22 ENCOUNTER — Ambulatory Visit: Payer: Medicare Other | Attending: Cardiology

## 2023-03-22 ENCOUNTER — Other Ambulatory Visit: Payer: Self-pay | Admitting: Family Medicine

## 2023-03-22 ENCOUNTER — Encounter: Payer: Self-pay | Admitting: Family Medicine

## 2023-03-22 DIAGNOSIS — Z136 Encounter for screening for cardiovascular disorders: Secondary | ICD-10-CM

## 2023-03-22 DIAGNOSIS — E559 Vitamin D deficiency, unspecified: Secondary | ICD-10-CM

## 2023-03-22 MED ORDER — SEMAGLUTIDE (1 MG/DOSE) 4 MG/3ML ~~LOC~~ SOPN
1.0000 mg | PEN_INJECTOR | SUBCUTANEOUS | 1 refills | Status: DC
Start: 2023-03-22 — End: 2023-11-10

## 2023-03-22 MED ORDER — TADALAFIL 20 MG PO TABS
10.0000 mg | ORAL_TABLET | ORAL | 11 refills | Status: AC | PRN
Start: 2023-03-22 — End: ?

## 2023-03-22 NOTE — Addendum Note (Signed)
Addended by: Abbe Amsterdam C on: 03/22/2023 07:13 PM   Modules accepted: Orders

## 2023-03-23 LAB — VITAMIN D 25 HYDROXY (VIT D DEFICIENCY, FRACTURES): Vit D, 25-Hydroxy: 35.4 ng/mL (ref 30.0–100.0)

## 2023-03-25 ENCOUNTER — Encounter: Payer: Self-pay | Admitting: Family Medicine

## 2023-04-07 ENCOUNTER — Encounter (HOSPITAL_COMMUNITY): Payer: Self-pay

## 2023-04-11 NOTE — Telephone Encounter (Signed)
Left detailed message on voice mail answering machine and also sent a message via my chart regarding his scheduled STRESS TEST on 04/14/23 at 8:00.

## 2023-04-14 ENCOUNTER — Ambulatory Visit (HOSPITAL_BASED_OUTPATIENT_CLINIC_OR_DEPARTMENT_OTHER): Payer: Medicare Other

## 2023-04-14 ENCOUNTER — Ambulatory Visit (HOSPITAL_COMMUNITY): Payer: Medicare Other | Attending: Cardiology

## 2023-04-14 DIAGNOSIS — I2510 Atherosclerotic heart disease of native coronary artery without angina pectoris: Secondary | ICD-10-CM | POA: Insufficient documentation

## 2023-04-14 DIAGNOSIS — I7121 Aneurysm of the ascending aorta, without rupture: Secondary | ICD-10-CM | POA: Insufficient documentation

## 2023-04-14 DIAGNOSIS — I2584 Coronary atherosclerosis due to calcified coronary lesion: Secondary | ICD-10-CM | POA: Insufficient documentation

## 2023-04-14 LAB — MYOCARDIAL PERFUSION IMAGING
Angina Index: 0
Duke Treadmill Score: 4
Estimated workload: 4.6
Exercise duration (min): 4 min
Exercise duration (sec): 1 s
LV dias vol: 88 mL (ref 62–150)
LV sys vol: 35 mL
MPHR: 147 {beats}/min
Nuc Stress EF: 60 %
Peak HR: 139 {beats}/min
Percent HR: 94 %
Rest HR: 67 {beats}/min
Rest Nuclear Isotope Dose: 8.1 mCi
SDS: 0
SRS: 1
SSS: 1
ST Depression (mm): 0 mm
Stress Nuclear Isotope Dose: 26.1 mCi
TID: 0.93

## 2023-04-14 LAB — ECHOCARDIOGRAM COMPLETE
AR max vel: 1.54 cm2
AV Area VTI: 1.64 cm2
AV Area mean vel: 1.65 cm2
AV Mean grad: 10.7 mm[Hg]
AV Peak grad: 21.5 mm[Hg]
Ao pk vel: 2.32 m/s
Area-P 1/2: 3.77 cm2
Height: 64 in
S' Lateral: 3.3 cm
Weight: 3584 [oz_av]

## 2023-04-14 MED ORDER — TECHNETIUM TC 99M TETROFOSMIN IV KIT
26.1000 | PACK | Freq: Once | INTRAVENOUS | Status: AC | PRN
  Administered 2023-04-14: 26.1 via INTRAVENOUS

## 2023-04-14 MED ORDER — TECHNETIUM TC 99M TETROFOSMIN IV KIT
8.1000 | PACK | Freq: Once | INTRAVENOUS | Status: AC | PRN
  Administered 2023-04-14: 8.1 via INTRAVENOUS

## 2023-04-15 ENCOUNTER — Ambulatory Visit (HOSPITAL_BASED_OUTPATIENT_CLINIC_OR_DEPARTMENT_OTHER)
Admission: RE | Admit: 2023-04-15 | Discharge: 2023-04-15 | Disposition: A | Discharge status: Home / Self Care | Payer: Medicare Other | Source: Ambulatory Visit | Attending: Family Medicine | Admitting: Family Medicine

## 2023-04-15 DIAGNOSIS — Z136 Encounter for screening for cardiovascular disorders: Secondary | ICD-10-CM | POA: Insufficient documentation

## 2023-04-15 DIAGNOSIS — F17210 Nicotine dependence, cigarettes, uncomplicated: Secondary | ICD-10-CM | POA: Diagnosis not present

## 2023-04-16 ENCOUNTER — Encounter: Payer: Self-pay | Admitting: Family Medicine

## 2023-04-16 NOTE — Progress Notes (Signed)
Exercise nuclear stress test Patient exercised on Bruce protocol for 4 minutes and achieved 4.6 METS.  Normal blood pressure response.   The study is normal. The study is low risk.   No ST deviation was noted.   LV perfusion is normal.   Left ventricular function is normal. Nuclear stress EF: 60%. The left ventricular ejection fraction is normal (55-65%). End diastolic cavity size is normal.   Prior study not available for comparison.

## 2023-04-16 NOTE — Progress Notes (Signed)
Echocardiogram 04/14/2023:   1. Left ventricular ejection fraction, by estimation, is 55 to 60%. The left ventricle has normal function. The left ventricle has no regional wall motion abnormalities. There is mild left ventricular hypertrophy. Left ventricular diastolic parameters were normal. The average left ventricular global longitudinal strain is 18.9 %. The global longitudinal strain is normal.  2. Right ventricular systolic function is normal. The right ventricular size is mildly enlarged.  3. The mitral valve is normal in structure. Trivial mitral valve regurgitation. No evidence of mitral stenosis.  4. The aortic valve is tricuspid. Aortic valve regurgitation is not visualized. Mild aortic valve stenosis.  5. Aortic dilatation noted. There is mild dilatation of the ascending aorta, measuring 42 mm.  6. The inferior vena cava is normal in size with greater than 50% respiratory variability, suggesting right atrial pressure of 3 mmHg.

## 2023-04-18 ENCOUNTER — Telehealth: Payer: Self-pay

## 2023-04-18 NOTE — Telephone Encounter (Signed)
PA denied.   OZEMPIC INJ 4MG /3ML is denied under your Medicare Advantage (MA) plan, which does not cover outpatient prescription drugs that may be eligible for coverage under Part D. Drug coverage provided by your plan is limited to drugs that would be covered under Part B of Original Medicare. Please refer to your Evidence of Coverage (EOC) for further information on the types of drugs that are covered. You may have a separate commercial pharmacy benefit or Medicare Part D benefit managed by another carrier. If you have prescription drug coverage with another carrier, please contact them directly for information about how to access your prescription drug benefit. Reviewed by: Cristela [email or web address]

## 2023-04-24 ENCOUNTER — Telehealth: Payer: Self-pay | Admitting: Cardiology

## 2023-04-24 NOTE — Telephone Encounter (Signed)
Patient's wife is calling requesting provider switch to Dr. Jens Som due to him seeing multiple patient's in his family.  Are you both in agreement with this?

## 2023-05-01 ENCOUNTER — Ambulatory Visit: Payer: Medicare Other

## 2023-05-25 ENCOUNTER — Ambulatory Visit: Payer: Medicare Other | Admitting: Cardiology

## 2023-06-05 NOTE — Progress Notes (Signed)
 HPI: Follow-up elevated calcium  score. Previously followed by Dr. Ladona but transitioning to me. Calcium  score September 2024 8103 which was 99th percentile, mildly dilated ascending aorta at 4.1 cm. Echocardiogram November 2024 showed normal LV function, mild left ventricular hypertrophy, mild right ventricular enlargement, mild aortic stenosis with mean gradient 10.7 mmHg, mild dilatation of the ascending aorta at 42 mm. Nuclear study November 2024 showed normal perfusion with ejection fraction 60%. Abdominal ultrasound November 2024 showed no aneurysm. Since last seen patient has dyspnea with more vigorous activities but not routine activities.  No orthopnea, PND, pedal edema, chest pain or syncope.  Current Outpatient Medications  Medication Sig Dispense Refill   aspirin 81 MG tablet Take 81 mg by mouth daily.     ezetimibe -simvastatin  (VYTORIN ) 10-10 MG tablet Take 1 tablet by mouth at bedtime. 30 tablet 2   losartan (COZAAR) 100 MG tablet Take 50 mg by mouth daily.     magnesium 30 MG tablet Take 30 mg by mouth 2 (two) times daily.     naproxen  (NAPROSYN ) 500 MG tablet      Semaglutide , 1 MG/DOSE, 4 MG/3ML SOPN Inject 1 mg into the skin once a week. 9 mL 1   sertraline (ZOLOFT) 100 MG tablet      tadalafil  (CIALIS ) 20 MG tablet Take 0.5-1 tablets (10-20 mg total) by mouth every other day as needed for erectile dysfunction. 10 tablet 11   No current facility-administered medications for this visit.     Past Medical History:  Diagnosis Date   Arthritis    s/p B TKR   Diabetes mellitus without complication (HCC)    Nephrolithiasis     Past Surgical History:  Procedure Laterality Date   JOINT REPLACEMENT     s/p B TKR   ROTATOR CUFF REPAIR     Bilateral.    Social History   Socioeconomic History   Marital status: Married    Spouse name: Not on file   Number of children: 1   Years of education: Not on file   Highest education level: Not on file  Occupational History    Occupation: Truck driver    Comment: drives locally  Tobacco Use   Smoking status: Never   Smokeless tobacco: Former    Types: Chew  Substance and Sexual Activity   Alcohol use: Yes    Alcohol/week: 6.0 standard drinks of alcohol    Types: 6 Cans of beer per week   Drug use: No   Sexual activity: Yes  Other Topics Concern   Not on file  Social History Narrative   Marital status:  Married x 43 years; happily      Children:  1 child; 3 grandchildren.      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  Plans to retire age 18.        Tobacco abuse: chewed for 30 years; quit.      Alcohol:  6 pack per week      Drugs: none      Exercise: none; job very physical.      Seatbelt: 100%      Guns: loaded and unsecured.         Social Drivers of Corporate Investment Banker Strain: Not on file  Food Insecurity: Not on file  Transportation Needs: Not on file  Physical Activity: Not on file  Stress: Not on file  Social Connections: Not on file  Intimate Partner Violence: Not on file  Family History  Problem Relation Age of Onset   Alzheimer's disease Mother    Heart disease Father 18       AMI    ROS: no fevers or chills, productive cough, hemoptysis, dysphasia, odynophagia, melena, hematochezia, dysuria, hematuria, rash, seizure activity, orthopnea, PND, pedal edema, claudication. Remaining systems are negative.  Physical Exam: Well-developed well-nourished in no acute distress.  Skin is warm and dry.  HEENT is normal.  Neck is supple.  Chest is clear to auscultation with normal expansion.  Cardiovascular exam is regular rate and rhythm.  2/6 systolic murmur left sternal border. Abdominal exam nontender or distended. No masses palpated. Extremities show no edema. neuro grossly intact  Electrocardiogram January 19, 2023-normal sinus rhythm with no ST changes.  A/P  1 elevated calcium  score/coronary calcification-severely elevated calcium  score.  However he  has no chest pain with activities and his follow-up nuclear study showed no ischemia suggesting no obstructive disease.  Plan medical therapy.  Continue aspirin 81 mg daily.  Continue statin.  Add Toprol  25 mg daily. Approximately 15 min spent prior to pt arrival reviewing pt records.  2 dilated ascending aorta-plan follow-up CTA September 2025.  3 hypertension-blood pressure elevated.  However he states typically controlled.  I am adding Toprol  as outlined above.  He will follow blood pressure and we will advance medications as needed.  Goal systolic blood pressure 130 or less and diastolic blood pressure 85 or less.  4 hyperlipidemia-he has not tolerated other statins previously.  Significant myalgias with these.  We will continue low-dose Vytorin .  Refer to lipid clinic for PCSK9 inhibitor as his most recent LDL was 124.  5 aortic stenosis-mild on recent echocardiogram.  Will repeat study November 2025.  Redell Shallow, MD

## 2023-06-19 ENCOUNTER — Other Ambulatory Visit: Payer: Self-pay | Admitting: *Deleted

## 2023-06-19 ENCOUNTER — Encounter: Payer: Self-pay | Admitting: Cardiology

## 2023-06-19 ENCOUNTER — Ambulatory Visit: Payer: Medicare Other | Attending: Cardiology | Admitting: Cardiology

## 2023-06-19 VITALS — BP 154/90 | HR 87 | Ht 67.0 in | Wt 242.0 lb

## 2023-06-19 DIAGNOSIS — E7800 Pure hypercholesterolemia, unspecified: Secondary | ICD-10-CM

## 2023-06-19 DIAGNOSIS — I7121 Aneurysm of the ascending aorta, without rupture: Secondary | ICD-10-CM

## 2023-06-19 DIAGNOSIS — I2584 Coronary atherosclerosis due to calcified coronary lesion: Secondary | ICD-10-CM | POA: Diagnosis not present

## 2023-06-19 DIAGNOSIS — I2510 Atherosclerotic heart disease of native coronary artery without angina pectoris: Secondary | ICD-10-CM | POA: Diagnosis not present

## 2023-06-19 DIAGNOSIS — I350 Nonrheumatic aortic (valve) stenosis: Secondary | ICD-10-CM

## 2023-06-19 DIAGNOSIS — I10 Essential (primary) hypertension: Secondary | ICD-10-CM

## 2023-06-19 MED ORDER — METOPROLOL SUCCINATE ER 25 MG PO TB24: 25.0000 mg | ORAL_TABLET | Freq: Every day | ORAL | 3 refills | Status: AC

## 2023-06-19 NOTE — Patient Instructions (Signed)
 Medication Instructions:   START METOPROLOL  25 MG ONCE DAILY AT BEDTIME  *If you need a refill on your cardiac medications before your next appointment, please call your pharmacy*  Testing/Procedures:  Your physician has requested that you have an echocardiogram. Echocardiography is a painless test that uses sound waves to create images of your heart. It provides your doctor with information about the size and shape of your heart and how well your heart's chambers and valves are working. This procedure takes approximately one hour. There are no restrictions for this procedure. Please do NOT wear cologne, perfume, aftershave, or lotions (deodorant is allowed). Please arrive 15 minutes prior to your appointment time.  Please note: We ask at that you not bring children with you during ultrasound (echo/ vascular) testing. Due to room size and safety concerns, children are not allowed in the ultrasound rooms during exams. Our front office staff cannot provide observation of children in our lobby area while testing is being conducted. An adult accompanying a patient to their appointment will only be allowed in the ultrasound room at the discretion of the ultrasound technician under special circumstances. We apologize for any inconvenience. [address]-SCHEDULE IN NOVEMBER  CTA OF THE CHEST AT [facility]= [address]=SCHEDULE IN SEPTEMBER   Follow-Up: At [facility], you and your health needs are our priority.  As part of our continuing mission to provide you with exceptional heart care, we have created designated Provider Care Teams.  These Care Teams include your primary Cardiologist (physician) and Advanced Practice Providers (APPs -  Physician Assistants and Nurse Practitioners) who all work together to provide you with the care you need, when you need it.  We recommend signing up for the patient portal called MyChart.  Sign up information is provided on  this After Visit Summary.  MyChart is used to connect with patients for Virtual Visits (Telemedicine).  Patients are able to view lab/test results, encounter notes, upcoming appointments, etc.  Non-urgent messages can be sent to your provider as well.   To learn more about what you can do with MyChart, go to [email or web address].    Your next appointment:   6 month(s)  Provider:   REDELL SHALLOW MD

## 2023-07-26 ENCOUNTER — Telehealth: Payer: Self-pay | Admitting: Family Medicine

## 2023-07-26 NOTE — Telephone Encounter (Signed)
Copied from CRM [id number]. Topic: General - Other >> Jul 26, 2023  1:16 PM Almira Coaster wrote: Reason for CRM: Marcelino Duster from [facility] is calling to verify if a faxed they sent on 07/24/2023 was received by the office. Best call back number is  [phone].

## 2023-07-28 ENCOUNTER — Ambulatory Visit: Payer: Medicare Other

## 2023-08-10 DIAGNOSIS — D1801 Hemangioma of skin and subcutaneous tissue: Secondary | ICD-10-CM | POA: Diagnosis not present

## 2023-08-10 DIAGNOSIS — L821 Other seborrheic keratosis: Secondary | ICD-10-CM | POA: Diagnosis not present

## 2023-08-10 DIAGNOSIS — L814 Other melanin hyperpigmentation: Secondary | ICD-10-CM | POA: Diagnosis not present

## 2023-08-22 ENCOUNTER — Telehealth: Payer: Self-pay

## 2023-08-22 ENCOUNTER — Ambulatory Visit: Payer: Medicare Other

## 2023-08-22 NOTE — Telephone Encounter (Signed)
 Unsuccessful attempts to reach patient on preferred number listed in notes for scheduled AWV. Left message on voicemail okay to reschedule.

## 2023-09-08 ENCOUNTER — Ambulatory Visit: Payer: Medicare Other

## 2023-10-10 ENCOUNTER — Ambulatory Visit (INDEPENDENT_AMBULATORY_CARE_PROVIDER_SITE_OTHER)

## 2023-10-10 VITALS — Ht 67.0 in | Wt 242.0 lb

## 2023-10-10 DIAGNOSIS — Z0000 Encounter for general adult medical examination without abnormal findings: Secondary | ICD-10-CM | POA: Diagnosis not present

## 2023-10-10 NOTE — Progress Notes (Signed)
 Subjective:   Nathan Byrd is a 73 y.o. who presents for a Medicare Wellness preventive visit.  Visit Complete: Virtual I connected with  Dermaine Chabra Riemann on 10/10/23 by a audio enabled telemedicine application and verified that I am speaking with the correct person using two identifiers.  Patient Location: Home  Provider Location: Home Office  I discussed the limitations of evaluation and management by telemedicine. The patient expressed understanding and agreed to proceed.  Vital Signs: Because this visit was a virtual/telehealth visit, some criteria may be missing or patient reported. Any vitals not documented were not able to be obtained and vitals that have been documented are patient reported.  VideoDeclined- This patient declined Librarian, academic. Therefore the visit was completed with audio only.  Persons Participating in Visit: Patient.  AWV Questionnaire: Yes: Patient Medicare AWV questionnaire was completed by the patient on 08/18/23; I have confirmed that all information answered by patient is correct and no changes since this date.  Cardiac Risk Factors include: advanced age (>84men, >26 women);hypertension;male gender;diabetes mellitus;dyslipidemia     Objective:    Today's Vitals   10/10/23 1529  Weight: 242 lb (109.8 kg)  Height: 5\' 7"  (1.702 m)   Body mass index is 37.9 kg/m.     10/10/2023    3:43 PM  Advanced Directives  Does Patient Have a Medical Advance Directive? No  Would patient like information on creating a medical advance directive? Yes (MAU/Ambulatory/Procedural Areas - Information given)    Current Medications (verified) Outpatient Encounter Medications as of 10/10/2023  Medication Sig   aspirin 81 MG tablet Take 81 mg by mouth daily.   ezetimibe -simvastatin  (VYTORIN ) 10-10 MG tablet Take 1 tablet by mouth at bedtime.   losartan (COZAAR) 100 MG tablet Take 50 mg by mouth daily.   magnesium 30 MG tablet Take 30  mg by mouth 2 (two) times daily.   metoprolol  succinate (TOPROL  XL) 25 MG 24 hr tablet Take 1 tablet (25 mg total) by mouth at bedtime.   naproxen  (NAPROSYN ) 500 MG tablet    Semaglutide , 1 MG/DOSE, 4 MG/3ML SOPN Inject 1 mg into the skin once a week.   sertraline (ZOLOFT) 100 MG tablet    tadalafil  (CIALIS ) 20 MG tablet Take 0.5-1 tablets (10-20 mg total) by mouth every other day as needed for erectile dysfunction.   No facility-administered encounter medications on file as of 10/10/2023.    Allergies (verified) Ace inhibitors, Amlodipine, Atorvastatin, Bee venom, Lisinopril, Lovastatin , Penicillins, Rosuvastatin , and Simvastatin    History: Past Medical History:  Diagnosis Date   Arthritis    s/p B TKR   Diabetes mellitus without complication (HCC)    Nephrolithiasis    Past Surgical History:  Procedure Laterality Date   JOINT REPLACEMENT     s/p B TKR   ROTATOR CUFF REPAIR     Bilateral.   Family History  Problem Relation Age of Onset   Alzheimer's disease Mother    Heart disease Father 18       AMI   Social History   Socioeconomic History   Marital status: Married    Spouse name: Not on file   Number of children: 1   Years of education: Not on file   Highest education level: Not on file  Occupational History   Occupation: Truck driver    Comment: drives locally  Tobacco Use   Smoking status: Never   Smokeless tobacco: Former    Types: Mudlogger and Sexual  Activity   Alcohol use: Yes    Alcohol/week: 6.0 standard drinks of alcohol    Types: 6 Cans of beer per week   Drug use: No   Sexual activity: Yes  Other Topics Concern   Not on file  Social History Narrative   Marital status:  Married x 43 years; happily      Children:  1 child; 3 grandchildren.      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        Tobacco abuse: chewed for 30 years; quit.      Alcohol:  6 pack per week      Drugs: none      Exercise: none;  job very physical.      Seatbelt: 100%      Guns: loaded and unsecured.         Social Drivers of Corporate investment banker Strain: Low Risk  (08/18/2023)   Overall Financial Resource Strain (CARDIA)    Difficulty of Paying Living Expenses: Not hard at all  Food Insecurity: No Food Insecurity (08/18/2023)   Hunger Vital Sign    Worried About Running Out of Food in the Last Year: Never true    Ran Out of Food in the Last Year: Never true  Transportation Needs: No Transportation Needs (08/18/2023)   PRAPARE - Administrator, Civil Service (Medical): No    Lack of Transportation (Non-Medical): No  Physical Activity: Insufficiently Active (08/18/2023)   Exercise Vital Sign    Days of Exercise per Week: 3 days    Minutes of Exercise per Session: 30 min  Stress: No Stress Concern Present (08/18/2023)   [employer] of Occupational Health - Occupational Stress Questionnaire    Feeling of Stress : Not at all  Social Connections: Unknown (08/18/2023)   Social Connection and Isolation Panel [NHANES]    Frequency of Communication with Friends and Family: More than three times a week    Frequency of Social Gatherings with Friends and Family: Three times a week    Attends Religious Services: Patient declined    Active Member of Clubs or Organizations: No    Attends Engineer, structural: More than 4 times per year    Marital Status: Married    Tobacco Counseling Counseling given: Not Answered    Clinical Intake:  Pre-visit preparation completed: Yes  Pain : No/denies pain     Diabetes: No  Lab Results  Component Value Date   HGBA1C 5.9 02/16/2023   HGBA1C 6.4 07/13/2020   HGBA1C 6.2 04/15/2019     How often do you need to have someone help you when you read instructions, pamphlets, or other written materials from your doctor or pharmacy?: 1 - Never  Interpreter Needed?: No  Information entered by :: Seabron Cypress LPN   Activities of Daily Living      08/18/2023    2:51 PM  In your present state of health, do you have any difficulty performing the following activities:  Hearing? 1  Vision? 1  Difficulty concentrating or making decisions? 0  Walking or climbing stairs? 0  Dressing or bathing? 0  Doing errands, shopping? 0  Preparing Food and eating ? N  Using the Toilet? N  In the past six months, have you accidently leaked urine? Y  Do you have problems with loss of bowel control? N  Managing your Medications? N  Managing your Finances? N  Housekeeping or managing your Housekeeping? N    Patient Care Team: Copland, Skipper Dumas, MD as PCP - General (Family Medicine) Knox Perl, MD as PCP - Cardiology (Cardiology) Clinic, [facility]  Indicate any recent Medical Services you may have received from other than [facility] providers in the past year (date may be approximate).     Assessment:   This is a routine wellness examination for [place].  Hearing/Vision screen Hearing Screening - Comments:: Some hearing loss  Vision Screening - Comments:: Wears rx glasses - [facility]    Goals Addressed             This Visit's Progress    Remain active and independent         Depression Screen     10/10/2023    3:41 PM 02/16/2023    1:17 PM 04/21/2016    8:32 AM 05/13/2015    2:53 PM 11/06/2014    4:23 PM  PHQ 2/9 Scores  PHQ - 2 Score 0 0 0 0 0    Fall Risk     08/18/2023    2:51 PM 02/16/2023    1:16 PM 07/13/2020   11:12 AM 04/21/2016    8:32 AM  Fall Risk   Falls in the past year? 0 0 0 No  Number falls in past yr: 0 0 0   Injury with Fall? 0 0 0   Risk for fall due to : No Fall Risks No Fall Risks    Follow up Falls prevention discussed;Education provided;Falls evaluation completed Falls evaluation completed      MEDICARE RISK AT HOME:  Medicare Risk at Home Any stairs in or around the home?: (Patient-Rptd) Yes If so, are there any without handrails?: (Patient-Rptd) No Home  free of loose throw rugs in walkways, pet beds, electrical cords, etc?: (Patient-Rptd) No Adequate lighting in your home to reduce risk of falls?: (Patient-Rptd) Yes Life alert?: (Patient-Rptd) No Use of a cane, walker or w/c?: (Patient-Rptd) No Grab bars in the bathroom?: (Patient-Rptd) No Shower chair or bench in shower?: (Patient-Rptd) Yes Elevated toilet seat or a handicapped toilet?: (Patient-Rptd) No  TIMED UP AND GO:  Was the test performed?  No  Cognitive Function: 6CIT completed        10/10/2023    3:49 PM  6CIT Screen  What Year? 0 points  What month? 0 points  What time? 0 points  Count back from 20 0 points  Months in reverse 0 points  Repeat phrase 0 points  Total Score 0 points    Immunizations Immunization History  Administered Date(s) Administered   Fluad Quad(high Dose 65+) 04/15/2019, 07/13/2020   Fluad Trivalent(High Dose 65+) 02/16/2023   Influenza,inj,Quad PF,6+ Mos 04/23/2018   Influenza-Unspecified 03/13/2013   Moderna Sars-Covid-2 Vaccination 10/09/2019, 11/06/2019   Pneumococcal Conjugate-13 06/19/2015   Pneumococcal Polysaccharide-23 10/11/2011, 04/15/2019   Td 02/16/2023   Td (Adult),5 Lf Tetanus Toxid, Preservative Free 05/28/2021   Tdap 05/20/2011   Zoster Recombinant(Shingrix) 03/13/2020, 07/27/2020    Screening Tests Health Maintenance  Topic Date Due   Diabetic kidney evaluation - Urine ACR  Never done   FOOT EXAM  07/13/2021   COVID-19 Vaccine (3 - 2024-25 season) 02/12/2023   Fecal DNA (Cologuard)  07/30/2023   HEMOGLOBIN A1C  08/16/2023   INFLUENZA VACCINE  01/12/2024   Diabetic kidney evaluation - eGFR measurement  02/16/2024   OPHTHALMOLOGY EXAM  03/02/2024   Medicare Annual Wellness (AWV)  10/09/2024   DTaP/Tdap/Td (4 - Td or Tdap) 02/15/2033   Pneumonia Vaccine 51+ Years old  Completed   Hepatitis C Screening  Completed   Zoster Vaccines- Shingrix  Completed   HPV VACCINES  Aged Out   Meningococcal B Vaccine  Aged Out    Colonoscopy  Discontinued    Health Maintenance  Health Maintenance Due  Topic Date Due   Diabetic kidney evaluation - Urine ACR  Never done   FOOT EXAM  07/13/2021   COVID-19 Vaccine (3 - 2024-25 season) 02/12/2023   Fecal DNA (Cologuard)  07/30/2023   HEMOGLOBIN A1C  08/16/2023    Additional Screening:  Vision Screening: Recommended annual ophthalmology exams for early detection of glaucoma and other disorders of the eye.  Dental Screening: Recommended annual dental exams for proper oral hygiene  Community Resource Referral / Chronic Care Management: CRR required this visit?  No   CCM required this visit?  No     Plan:     I have personally reviewed and noted the following in the patient's chart:   Medical and social history Use of alcohol, tobacco or illicit drugs  Current medications and supplements including opioid prescriptions. Patient is not currently taking opioid prescriptions. Functional ability and status Nutritional status Physical activity Advanced directives List of other physicians Hospitalizations, surgeries, and ER visits in previous 12 months Vitals Screenings to include cognitive, depression, and falls Referrals and appointments  In addition, I have reviewed and discussed with patient certain preventive protocols, quality metrics, and best practice recommendations. A written personalized care plan for preventive services as well as general preventive health recommendations were provided to patient.     Seabron Cypress [place], [place]   2/54/2706   After Visit Summary: (MyChart) Due to this being a telephonic visit, the after visit summary with patients personalized plan was offered to patient via MyChart   Notes: Nothing significant to report at this time.

## 2023-10-10 NOTE — Patient Instructions (Signed)
 Mr. Wintle , Thank you for taking time to come for your Medicare Wellness Visit. I appreciate your ongoing commitment to your health goals. Please review the following plan we discussed and let me know if I can assist you in the future.   Referrals/Orders/Follow-Ups/Clinician Recommendations: Aim for 30 minutes of exercise or brisk walking, 6-8 glasses of water, and 5 servings of fruits and vegetables each day.  This is a list of the screening recommended for you and due dates:  Health Maintenance  Topic Date Due   Eye exam for diabetics  Never done   Yearly kidney health urinalysis for diabetes  Never done   Complete foot exam   07/13/2021   COVID-19 Vaccine (3 - 2024-25 season) 02/12/2023   Cologuard (Stool DNA test)  07/30/2023   Hemoglobin A1C  08/16/2023   Flu Shot  01/12/2024   Yearly kidney function blood test for diabetes  02/16/2024   Medicare Annual Wellness Visit  10/09/2024   DTaP/Tdap/Td vaccine (4 - Td or Tdap) 02/15/2033   Pneumonia Vaccine  Completed   Hepatitis C Screening  Completed   Zoster (Shingles) Vaccine  Completed   HPV Vaccine  Aged Out   Meningitis B Vaccine  Aged Out   Colon Cancer Screening  Discontinued    Advanced directives: (ACP Link)Information on Advanced Care Planning can be found at [place]  Secretary of [facility] Advance Health Care Directives Advance Health Care Directives. [email or web address]   Next Medicare Annual Wellness Visit scheduled for next year: Yes

## 2023-10-23 DIAGNOSIS — M19012 Primary osteoarthritis, left shoulder: Secondary | ICD-10-CM | POA: Diagnosis not present

## 2023-11-10 ENCOUNTER — Ambulatory Visit: Attending: Cardiology | Admitting: Pharmacist Clinician (PhC)/ Clinical Pharmacy Specialist

## 2023-11-10 ENCOUNTER — Encounter: Payer: Self-pay | Admitting: Pharmacist Clinician (PhC)/ Clinical Pharmacy Specialist

## 2023-11-10 DIAGNOSIS — E7800 Pure hypercholesterolemia, unspecified: Secondary | ICD-10-CM

## 2023-11-10 DIAGNOSIS — E785 Hyperlipidemia, unspecified: Secondary | ICD-10-CM | POA: Diagnosis not present

## 2023-11-10 NOTE — Patient Instructions (Signed)
 Your Results:             Your most recent labs Goal  Total Cholesterol 204 < 200  Triglycerides 139 < 150  HDL (happy/good cholesterol) 52.8 > 40  LDL (lousy/bad cholesterol 124 < 55   Medication changes:  We will start the process to get Repatha covered by your insurance.  Once the prior authorization is complete, I will call/send a MyChart message to let you know and confirm pharmacy information.   You will take one injection every 14 days  Lab orders:  We want to repeat labs today then after 2-3 months.  We will send you a lab order to remind you once we get closer to that time.    Patient Assistance:    We will sign you up for a Healthwell Grant once your medication is approved by [employer].  I will call you with the ID number, then you will take this information to the pharmacy.  They will bill it after your insurance, bringing your copay to $0.  The grant will pay the first $2,500 in a one year period.    ID   BIN [id number]  PCN [id number]  GRP [id number]    Thank you for choosing [facility]

## 2023-11-10 NOTE — Assessment & Plan Note (Signed)
 Assessment: Patient with CAD not at LDL goal of < 55 Most recent LDL 124 on 02/16/23 Has been compliant with low intensity statin/ezetimibe  : ezetimibe /simvastatin  10/10 Not able to tolerate higher doses or other statins secondary to myalgias Reviewed options for lowering LDL cholesterol, including ezetimibe , PCSK-9 inhibitors, bempedoic acid and inclisiran.  Discussed mechanisms of action, dosing, side effects, potential decreases in LDL cholesterol and costs.  Also reviewed potential options for patient assistance.  Plan: Patient agreeable to starting Repatha 140 mg q14d Repeat labs today for baseline, then 2-3 months after starting medication Lipid Liver function Patient was given information on [employer] - will sign patient up when PA approved Marital status Income < $72,000 (single) or < $102,000 (married)

## 2023-11-10 NOTE — Progress Notes (Signed)
 Office Visit    Patient Name: Nathan Byrd Date of Encounter: 11/10/2023  Primary Care Provider:  Kaylee Partridge, MD Primary Cardiologist:  Nathan Perl, MD  Chief Complaint    Hyperlipidemia   Significant Past Medical History   CAD 9/24 CAC = 8103 (99th percentile)  HTN Elevated at last visit, added metoprolol   preDM 9/24 A1c 5.9     Allergies  Allergen Reactions   Ace Inhibitors Swelling    angioedema   Amlodipine     Other Reaction(s): ANGIOEDEMA OF LIPS   Atorvastatin     Other Reaction(s): Muscle pain   Bee Venom    Lisinopril    Lovastatin      Other Reaction(s): Muscle pain   Penicillins    Rosuvastatin      Other Reaction(s): Muscle pain   Simvastatin      Other Reaction(s): Muscle pain    History of Present Illness    Nathan Byrd is a 73 y.o. male patient of Nathan Byrd, in the office today to discuss options for cholesterol management.  He presents to the office today with his wife.  He had coronary calcium  scoring done last fall, after his brother had this done and had a score that was elevated, apparently in the 4000 range.  Patient score came back at 8103 and he notes it is double what his brother got.  He has not had cholesterol labs drawn since the scan was done.  Today he notes that he quit dipping tobacco last fall when Nathan Byrd stressed abstinence.  Unfortunately he then went on to gain over 15 pounds.  Most recently he saw Nathan. Audery Byrd (new cardiologist) and was started on metoprolol  succ 25 mg daily.  Patient reported this caused him quite a bit of dizziness and he started cutting the tablets in half.  Has done much better tolerating the 12.5 mg daily.   Needs updated labs  Insurance Carrier:  [employer]HC Medicare (separate PDP?)  Pharmacy:   [facility]  Healthwell:   yes   LDL Cholesterol goal:  LDL < 70  Current Medications:  ezetimibe /simvastatin  10/10 mg daily    Previously tried:  lovastatin , rosuvastatin , atorvastatin,  higher doses of simvastatin   Family Hx: father had CABG at 77 (3-4) died at 29, mom 70 dementia, no heart history, was DM, brother CAC 4000, both sisters DM, all 3 sibs on Ozempic      Social Hx: Tobacco: quit in Sept 24 Alcohol: occasionally  Diet: mix of home and eating out.  Variety of proteins, not as many vegetables - prefers potatoes, pintos;      Exercise: no significant[place]  Accessory Clinical Findings   Lab Results  Component Value Date   CHOL 204 (H) 02/16/2023   HDL 52.80 02/16/2023   LDLCALC 124 (H) 02/16/2023   TRIG 139.0 02/16/2023   CHOLHDL 4 02/16/2023    No results found for: "LIPOA"  Lab Results  Component Value Date   ALT 15 02/16/2023   AST 16 02/16/2023   ALKPHOS 73 02/16/2023   BILITOT 1.6 (H) 02/16/2023   Lab Results  Component Value Date   CREATININE 0.79 02/16/2023   BUN 13 02/16/2023   NA 141 02/16/2023   K 4.5 02/16/2023   CL 102 02/16/2023   CO2 32 02/16/2023   Lab Results  Component Value Date   HGBA1C 5.9 02/16/2023    Home Medications    Current Outpatient Medications  Medication Sig Dispense Refill  aspirin 81 MG tablet Take 81 mg by mouth daily.     ezetimibe -simvastatin  (VYTORIN ) 10-10 MG tablet Take 1 tablet by mouth at bedtime. 30 tablet 2   losartan (COZAAR) 100 MG tablet Take 50 mg by mouth daily.     magnesium 30 MG tablet Take 30 mg by mouth daily.     sertraline (ZOLOFT) 100 MG tablet Take 50 mg by mouth daily.     tadalafil  (CIALIS ) 20 MG tablet Take 0.5-1 tablets (10-20 mg total) by mouth every other day as needed for erectile dysfunction. 10 tablet 11   metoprolol  succinate (TOPROL  XL) 25 MG 24 hr tablet Take 1 tablet (25 mg total) by mouth at bedtime. 90 tablet 3   No current facility-administered medications for this visit.     Assessment & Plan    Dyslipidemia Assessment: Patient with CAD not at LDL goal of < 55 Most recent LDL 124 on 02/16/23 Has been compliant with low intensity  statin/ezetimibe  : ezetimibe /simvastatin  10/10 Not able to tolerate higher doses or other statins secondary to myalgias Reviewed options for lowering LDL cholesterol, including ezetimibe , PCSK-9 inhibitors, bempedoic acid and inclisiran.  Discussed mechanisms of action, dosing, side effects, potential decreases in LDL cholesterol and costs.  Also reviewed potential options for patient assistance.  Plan: Patient agreeable to starting Repatha 140 mg q14d Repeat labs today for baseline, then 2-3 months after starting medication Lipid Liver function Patient was given information on [employer] - will sign patient up when PA approved Marital status Income < $72,000 (single) or < $102,000 (married)   Donivan Furry, PharmD CPP [facility] [address]  [place], [place] [address] [phone]  11/10/2023, 2:20 PM

## 2023-11-11 LAB — LIPID PANEL
Chol/HDL Ratio: 4.3 ratio (ref 0.0–5.0)
Cholesterol, Total: 224 mg/dL — ABNORMAL HIGH (ref 100–199)
HDL: 52 mg/dL (ref >39)
LDL Chol Calc (NIH): 142 mg/dL — ABNORMAL HIGH (ref 0–99)
Triglycerides: 167 mg/dL — ABNORMAL HIGH (ref 0–149)
VLDL Cholesterol Cal: 30 mg/dL (ref 5–40)

## 2023-11-11 LAB — HEPATIC FUNCTION PANEL
ALT: 21 IU/L (ref 0–44)
AST: 20 IU/L (ref 0–40)
Albumin: 4.3 g/dL (ref 3.8–4.8)
Alkaline Phosphatase: 95 IU/L (ref 44–121)
Bilirubin Total: 0.8 mg/dL (ref 0.0–1.2)
Bilirubin, Direct: 0.25 mg/dL (ref 0.00–0.40)
Total Protein: 6.9 g/dL (ref 6.0–8.5)

## 2023-11-13 ENCOUNTER — Ambulatory Visit: Payer: Self-pay | Admitting: Pharmacist Clinician (PhC)/ Clinical Pharmacy Specialist

## 2023-11-13 ENCOUNTER — Other Ambulatory Visit (HOSPITAL_COMMUNITY): Payer: Self-pay

## 2023-11-13 ENCOUNTER — Telehealth: Payer: Self-pay | Admitting: Pharmacy Technician

## 2023-11-13 ENCOUNTER — Telehealth: Payer: Self-pay | Admitting: Pharmacist Clinician (PhC)/ Clinical Pharmacy Specialist

## 2023-11-13 DIAGNOSIS — E7800 Pure hypercholesterolemia, unspecified: Secondary | ICD-10-CM

## 2023-11-13 NOTE — Telephone Encounter (Signed)
 Please do Repatha PA

## 2023-11-13 NOTE — Telephone Encounter (Signed)
 Pharmacy Patient Advocate Encounter  Received notification from [facility] that Prior Authorization for Repatha has been APPROVED from 11/13/23 to 05/14/24. Ran test claim, Copay is $302.00- one month. This test claim was processed through [facility]- copay amounts may vary at other pharmacies due to pharmacy/plan contracts, or as the patient moves through the different stages of their insurance plan.   PA #/Case ID/Reference #: [id number]

## 2023-11-13 NOTE — Telephone Encounter (Signed)
 Pharmacy Patient Advocate Encounter   Received notification from Pt Calls Messages that prior authorization for Repatha is required/requested.   Insurance verification completed.   The patient is insured through [place] .   Per test claim: PA required; PA submitted to above mentioned insurance via CoverMyMeds Key/confirmation #/EOC [id number] Status is pending

## 2023-11-16 MED ORDER — REPATHA SURECLICK 140 MG/ML ~~LOC~~ SOAJ: 140.0000 mg | SUBCUTANEOUS | 3 refills | Status: AC

## 2023-11-16 NOTE — Addendum Note (Signed)
 Addended by: Carmie Lanpher L on: 11/16/2023 09:10 AM   Modules accepted: Orders

## 2023-12-04 NOTE — Progress Notes (Signed)
 [facility] [address] [place], [place] [address] [phone]  Date:  12/07/2023   Name:  Nathan Byrd   DOB:  [birth year]   MRN:  [id number]  PCP:  Watt Harlene BROCKS, MD    Chief Complaint: No chief complaint on file.   History of Present Illness:  Nathan Byrd is a 73 y.o. very pleasant male patient who presents with the following:  Patient seen today to discuss a Ozempic  refill. Virtual visit today, patient location is his car (confirmed he is in [place]) and my location is office.  Patient identity confirmed with 2 factors, he gives consent for virtual visit today  Most recently seen by myself in September of last year for physical exam-he also gets some medical care at the [facility]  History of overweight, dyslipidemia, diabetes currently on diet control only Pt notes Dr Pietro would like him to lose weight and recommended starting a GLP-1 He was using ozempic  from a weight loss center - however he stopped using it about 6 months and put on about 30 lbs after stopping. His weight is now up to 252.  He is doing exercise by walking outside and doing yard work.  He has stopped using tobacco which causes him to crave food more and snack more.  When he was on ozempic  he was able to control his appetite and not eat esp in the evening   He is committed to continuing his exercise efforts once he starts a GLP-1.  He also hopes to be able to decrease his caloric intake further with the appetite suppression seen with GLP-1 medication  No contra to a GLP-1.  Never had pancreatitis   Lab Results  Component Value Date   HGBA1C 5.9 02/16/2023   Repatha  Vytorin  Aspirin 81 Toprol -XL Sertraline  It looks like he is due for repeat Cologuard- he has the box on hand   Patient Active Problem List   Diagnosis Date Noted   Essential hypertension 04/15/2019   Dyslipidemia 05/12/2016   Diet-controlled diabetes mellitus (HCC)  08/27/2014   Overweight and obesity(278.0) 05/05/2013    Past Medical History:  Diagnosis Date   Arthritis    s/p B TKR   Diabetes mellitus without complication (HCC)    Nephrolithiasis     Past Surgical History:  Procedure Laterality Date   JOINT REPLACEMENT     s/p B TKR   ROTATOR CUFF REPAIR     Bilateral.    Social History   Tobacco Use   Smoking status: Never   Smokeless tobacco: Former    Types: Chew  Substance Use Topics   Alcohol use: Yes    Alcohol/week: 6.0 standard drinks of alcohol    Types: 6 Cans of beer per week   Drug use: No    Family History  Problem Relation Age of Onset   Alzheimer's disease Mother    Heart disease Father 47       AMI    Allergies  Allergen Reactions   Ace Inhibitors Swelling    angioedema   Amlodipine     Other Reaction(s): ANGIOEDEMA OF LIPS   Atorvastatin     Other Reaction(s): Muscle pain   Bee Venom    Lisinopril    Lovastatin      Other Reaction(s): Muscle pain   Penicillins    Rosuvastatin      Other Reaction(s): Muscle pain   Simvastatin      Other  Reaction(s): Muscle pain    Medication list has been reviewed and updated.  Current Outpatient Medications on File Prior to Visit  Medication Sig Dispense Refill   aspirin 81 MG tablet Take 81 mg by mouth daily.     Evolocumab  (REPATHA  SURECLICK) 140 MG/ML SOAJ Inject 140 mg into the skin every 14 (fourteen) days. 6 mL 3   ezetimibe -simvastatin  (VYTORIN ) 10-10 MG tablet Take 1 tablet by mouth at bedtime. 30 tablet 2   losartan (COZAAR) 100 MG tablet Take 50 mg by mouth daily.     magnesium 30 MG tablet Take 30 mg by mouth daily.     metoprolol  succinate (TOPROL  XL) 25 MG 24 hr tablet Take 1 tablet (25 mg total) by mouth at bedtime. 90 tablet 3   sertraline (ZOLOFT) 100 MG tablet Take 50 mg by mouth daily.     tadalafil  (CIALIS ) 20 MG tablet Take 0.5-1 tablets (10-20 mg total) by mouth every other day as needed for erectile dysfunction. 10 tablet 11   No  current facility-administered medications on file prior to visit.    Review of Systems:  As per HPI- otherwise negative.   Physical Examination: There were no vitals filed for this visit. There were no vitals filed for this visit. There is no height or weight on file to calculate BMI. Ideal Body Weight:   Patient observed via MyChart video.  He looks well, no distress   Assessment and Plan: Dyslipidemia  Essential hypertension  Controlled type 2 diabetes mellitus with complication, without long-term current use of insulin (HCC) - Plan: Semaglutide ,0.25 or 0.5MG /DOS, (OZEMPIC , 0.25 OR 0.5 MG/DOSE,) 2 MG/3ML SOPN  Patient seen today virtually to discuss weight gain and diabetes control.  He is at high risk for heart disease and his cardiologist has recommended weight loss.  He was previously obtaining Ozempic  from a weight loss clinic but has since stopped using this.  He would like to obtain a prescription for Ozempic  if possible.  He is working on diet and exercise but needs help with controlling his appetite.  Prescribed Ozempic  0.25/0.5 pen in distress titration process.  He will keep me posted about how he is doing and we will plan to see me in person in the next few months  Signed Harlene Schroeder, MD

## 2023-12-07 ENCOUNTER — Telehealth (INDEPENDENT_AMBULATORY_CARE_PROVIDER_SITE_OTHER): Admitting: Family Medicine

## 2023-12-07 DIAGNOSIS — Z7985 Long-term (current) use of injectable non-insulin antidiabetic drugs: Secondary | ICD-10-CM

## 2023-12-07 DIAGNOSIS — E785 Hyperlipidemia, unspecified: Secondary | ICD-10-CM | POA: Diagnosis not present

## 2023-12-07 DIAGNOSIS — I10 Essential (primary) hypertension: Secondary | ICD-10-CM | POA: Diagnosis not present

## 2023-12-07 DIAGNOSIS — E118 Type 2 diabetes mellitus with unspecified complications: Secondary | ICD-10-CM

## 2023-12-07 MED ORDER — OZEMPIC (0.25 OR 0.5 MG/DOSE) 2 MG/3ML ~~LOC~~ SOPN: PEN_INJECTOR | SUBCUTANEOUS | 1 refills | Status: DC

## 2023-12-29 ENCOUNTER — Telehealth: Payer: Self-pay

## 2023-12-29 NOTE — Telephone Encounter (Signed)
 Preop televisit now scheduled, med rec and consent done. As I were verifying patient's pharmacy, location in [place]  was given. Pt states that he will be in [place]  for his televisit as I stated that the patient must reside in [place] during the appointment.

## 2023-12-29 NOTE — Telephone Encounter (Signed)
   Name: Nathan Byrd  DOB: [birth year]  MRN: [id number]  Primary Cardiologist: Gordy Bergamo, MD   Preoperative team, please contact this patient and set up a phone call appointment for further preoperative risk assessment. Please obtain consent and complete medication review. Thank you for your help.  I confirm that guidance regarding antiplatelet and oral anticoagulation therapy has been completed and, if necessary, noted below.  I also confirmed the patient resides in the state of [place] . As per [facility] Medical Board telemedicine laws, the patient must reside in the state in which the provider is licensed.   Artist Pouch, PA-C 12/29/2023, 9:06 AM [facility]

## 2023-12-29 NOTE — Telephone Encounter (Signed)
   Pre-operative Risk Assessment    Patient Name: Nathan Byrd  DOB: September 23, 1950 MRN: [id number]   Date of last office visit: 11/10/23 Date of next office visit: Not scheduled   Request for Surgical Clearance    Procedure:  Left Reverse Shoulder Arthroplasty  Date of Surgery:  Clearance TBD                                Surgeon:  Dr. Franky Pointer Surgeon's Group or Practice Name:  [facility] Phone number:  [phone] Fax number:  [phone]   Type of Clearance Requested:   - Medical    Type of Anesthesia:  General    Additional requests/questions:    [name]   12/29/2023, 8:57 AM

## 2023-12-29 NOTE — Telephone Encounter (Signed)
  Patient Consent for Virtual Visit        Nathan Byrd has provided verbal consent on 12/29/2023 for a virtual visit (video or telephone).   CONSENT FOR VIRTUAL VISIT FOR:  Nathan Byrd  By participating in this virtual visit I agree to the following:  I hereby voluntarily request, consent and authorize [facility] and its employed or contracted physicians, physician assistants, nurse practitioners or other licensed health care professionals (the Practitioner), to provide me with telemedicine health care services (the "Services) as deemed necessary by the treating Practitioner. I acknowledge and consent to receive the Services by the Practitioner via telemedicine. I understand that the telemedicine visit will involve communicating with the Practitioner through live audiovisual communication technology and the disclosure of certain medical information by electronic transmission. I acknowledge that I have been given the opportunity to request an in-person assessment or other available alternative prior to the telemedicine visit and am voluntarily participating in the telemedicine visit.  I understand that I have the right to withhold or withdraw my consent to the use of telemedicine in the course of my care at any time, without affecting my right to future care or treatment, and that the Practitioner or I may terminate the telemedicine visit at any time. I understand that I have the right to inspect all information obtained and/or recorded in the course of the telemedicine visit and may receive copies of available information for a reasonable fee.  I understand that some of the potential risks of receiving the Services via telemedicine include:  Delay or interruption in medical evaluation due to technological equipment failure or disruption; Information transmitted may not be sufficient (e.g. poor resolution of images) to allow for appropriate medical decision making by the Practitioner;  and/or  In rare instances, security protocols could fail, causing a breach of personal health information.  Furthermore, I acknowledge that it is my responsibility to provide information about my medical history, conditions and care that is complete and accurate to the best of my ability. I acknowledge that Practitioner's advice, recommendations, and/or decision may be based on factors not within their control, such as incomplete or inaccurate data provided by me or distortions of diagnostic images or specimens that may result from electronic transmissions. I understand that the practice of medicine is not an exact science and that Practitioner makes no warranties or guarantees regarding treatment outcomes. I acknowledge that a copy of this consent can be made available to me via my patient portal [facility] MyChart), or I can request a printed copy by calling the office of [facility].    I understand that my insurance will be billed for this visit.   I have read or had this consent read to me. I understand the contents of this consent, which adequately explains the benefits and risks of the Services being provided via telemedicine.  I have been provided ample opportunity to ask questions regarding this consent and the Services and have had my questions answered to my satisfaction. I give my informed consent for the services to be provided through the use of telemedicine in my medical care

## 2024-01-10 NOTE — Progress Notes (Unsigned)
 Virtual Visit via Telephone Note   Because of [name] co-morbid illnesses, he is at least at moderate risk for complications without adequate follow up.  This format is felt to be most appropriate for this patient at this time.  Due to technical limitations with video connection (technology), today's appointment will be conducted as an audio only telehealth visit, and SHA AMER verbally agreed to proceed in this manner.   All issues noted in this document were discussed and addressed.  No physical exam could be performed with this format.  Evaluation Performed:  Preoperative cardiovascular risk assessment _____________   Date:  01/10/2024   Patient ID:  Nathan Byrd, DOB January 29, 1951, MRN [id number] Patient Location:  Home Provider location:   Office  Primary Care Provider:  Watt Harlene BROCKS, MD Primary Cardiologist:  Gordy Bergamo, MD  Chief Complaint / Patient Profile   73 y.o. y/o male with a h/o coronary artery disease, hypertension, aortic aneurysm, aortic valve stenosis who is pending left shoulder reverse arthroplasty and presents today for telephonic preoperative cardiovascular risk assessment.  History of Present Illness    Nathan Byrd is a 73 y.o. male who presents via audio/video conferencing for a telehealth visit today.  Pt was last seen in cardiology clinic on 06/19/2023 by Dr. Pietro.  At that time Nathan Byrd was doing well .  The patient is now pending procedure as outlined above. Since his last visit, he remains stable from a cardiac standpoint.  Today he denies chest pain, shortness of breath, lower extremity edema, fatigue, palpitations, melena, hematuria, hemoptysis, diaphoresis, weakness, presyncope, syncope, orthopnea, and PND.   Past Medical History    Past Medical History:  Diagnosis Date   Arthritis    s/p B TKR   Diabetes mellitus without complication (HCC)    Nephrolithiasis    Past Surgical History:  Procedure Laterality Date    JOINT REPLACEMENT     s/p B TKR   ROTATOR CUFF REPAIR     Bilateral.    Allergies  Allergies  Allergen Reactions   Ace Inhibitors Swelling    angioedema   Amlodipine     Other Reaction(s): ANGIOEDEMA OF LIPS   Atorvastatin     Other Reaction(s): Muscle pain   Bee Venom    Lisinopril    Lovastatin      Other Reaction(s): Muscle pain   Penicillins    Rosuvastatin      Other Reaction(s): Muscle pain   Simvastatin      Other Reaction(s): Muscle pain    Home Medications    Prior to Admission medications   Medication Sig Start Date End Date Taking? Authorizing Provider  aspirin 81 MG tablet Take 81 mg by mouth daily.    [provider]  Evolocumab  (REPATHA  SURECLICK) 140 MG/ML SOAJ Inject 140 mg into the skin every 14 (fourteen) days. 11/16/23   Bergamo Gordy, MD  ezetimibe -simvastatin  (VYTORIN ) 10-10 MG tablet Take 1 tablet by mouth at bedtime. 03/21/23   Bergamo Gordy, MD  losartan (COZAAR) 100 MG tablet Take 50 mg by mouth daily.    [provider]  magnesium 30 MG tablet Take 30 mg by mouth daily.    [provider]  metoprolol  succinate (TOPROL  XL) 25 MG 24 hr tablet Take 1 tablet (25 mg total) by mouth at bedtime. 06/19/23   Pietro Redell RAMAN, MD  Semaglutide ,0.25 or 0.5MG /DOS, (OZEMPIC , 0.25 OR 0.5 MG/DOSE,) 2 MG/3ML SOPN Inject 0.25 mg weekly for one month, then increase to  0.5mg  weekly. 12/07/23   Copland, Jessica C, MD  sertraline (ZOLOFT) 100 MG tablet Take 50 mg by mouth daily. 03/23/16   [provider]  tadalafil  (CIALIS ) 20 MG tablet Take 0.5-1 tablets (10-20 mg total) by mouth every other day as needed for erectile dysfunction. 03/22/23   Copland, Harlene BROCKS, MD    Physical Exam    Vital Signs:  GREIG ALTERGOTT does not have vital signs available for review today.  Given telephonic nature of communication, physical exam is limited. AAOx3. NAD. Normal affect.  Speech and respirations are unlabored.  Accessory Clinical Findings     None  Assessment & Plan    1.  Preoperative Cardiovascular Risk Assessment: Left Reverse Shoulder Arthroplasty   Date of Surgery:  Clearance TBD                                  Surgeon:  Dr. Franky Pointer Surgeon's Group or Practice Name:  [facility] Phone number:  [phone] Fax number:  [phone]      Primary Cardiologist: Gordy Bergamo, MD  Chart reviewed as part of pre-operative protocol coverage. Given past medical history and time since last visit, based on ACC/AHA guidelines, [place] D Sprick would be at acceptable risk for the planned procedure without further cardiovascular testing.   His RCRI is low risk, 0.9% risk of major cardiac event.  He is able to complete greater than 4 METS of physical activity.  Patient was advised that if he develops new symptoms prior to surgery to contact our office to arrange a follow-up appointment.  He verbalized understanding.    I will route this recommendation to the requesting party via Epic fax function and remove from pre-op pool.       Time:   Today, I have spent 5 minutes with the patient with telehealth technology discussing medical history, symptoms, and management plan.  I spent 10 minutes reviewing patient's past cardiac history and cardiac medications.    Josefa CHRISTELLA Beauvais, NP  01/10/2024, 1:15 PM

## 2024-01-11 ENCOUNTER — Ambulatory Visit: Attending: Cardiology

## 2024-01-11 DIAGNOSIS — Z01810 Encounter for preprocedural cardiovascular examination: Secondary | ICD-10-CM

## 2024-02-14 ENCOUNTER — Encounter: Payer: Self-pay | Admitting: Family Medicine

## 2024-02-16 ENCOUNTER — Ambulatory Visit (HOSPITAL_BASED_OUTPATIENT_CLINIC_OR_DEPARTMENT_OTHER): Payer: Medicare Other

## 2024-02-18 MED ORDER — SEMAGLUTIDE (1 MG/DOSE) 4 MG/3ML ~~LOC~~ SOPN: 1.0000 mg | PEN_INJECTOR | SUBCUTANEOUS | 1 refills | Status: DC

## 2024-02-23 ENCOUNTER — Ambulatory Visit (HOSPITAL_BASED_OUTPATIENT_CLINIC_OR_DEPARTMENT_OTHER)
Admission: RE | Admit: 2024-02-23 | Discharge: 2024-02-23 | Disposition: A | Discharge status: Home / Self Care | Source: Ambulatory Visit | Attending: Cardiology | Admitting: Cardiology

## 2024-02-23 DIAGNOSIS — I7121 Aneurysm of the ascending aorta, without rupture: Secondary | ICD-10-CM | POA: Diagnosis not present

## 2024-02-23 DIAGNOSIS — I2510 Atherosclerotic heart disease of native coronary artery without angina pectoris: Secondary | ICD-10-CM | POA: Diagnosis not present

## 2024-02-23 DIAGNOSIS — N200 Calculus of kidney: Secondary | ICD-10-CM | POA: Diagnosis not present

## 2024-02-23 LAB — POCT I-STAT CREATININE: Creatinine, Ser: 0.9 mg/dL (ref 0.61–1.24)

## 2024-02-23 MED ORDER — IOHEXOL 350 MG/ML SOLN
100.0000 mL | Freq: Once | INTRAVENOUS | Status: AC | PRN
  Administered 2024-02-23: 100 mL via INTRAVENOUS

## 2024-02-28 ENCOUNTER — Ambulatory Visit: Payer: Self-pay | Admitting: Cardiology

## 2024-02-28 DIAGNOSIS — I7121 Aneurysm of the ascending aorta, without rupture: Secondary | ICD-10-CM

## 2024-03-01 DIAGNOSIS — R2232 Localized swelling, mass and lump, left upper limb: Secondary | ICD-10-CM | POA: Diagnosis not present

## 2024-03-01 DIAGNOSIS — M7989 Other specified soft tissue disorders: Secondary | ICD-10-CM | POA: Diagnosis not present

## 2024-03-01 DIAGNOSIS — L539 Erythematous condition, unspecified: Secondary | ICD-10-CM | POA: Diagnosis not present

## 2024-03-01 DIAGNOSIS — Z87891 Personal history of nicotine dependence: Secondary | ICD-10-CM | POA: Diagnosis not present

## 2024-03-01 DIAGNOSIS — M11242 Other chondrocalcinosis, left hand: Secondary | ICD-10-CM | POA: Diagnosis not present

## 2024-03-25 NOTE — Progress Notes (Addendum)
 Nathan Byrd                                          MRN: [id number]   03/25/2024   The VBCI Quality Team Specialist reviewed this patient medical record for the purposes of chart review for care gap closure. The following were reviewed: chart review for care gap closure-colorectal cancer screening, diabetic eye exam, glycemic status assessment, and kidney health evaluation for diabetes:eGFR  and uACR.  06/17/2024- no GSD to close for 2025 measurement year.    VBCI Quality Team

## 2024-04-17 ENCOUNTER — Telehealth: Payer: Self-pay

## 2024-04-17 NOTE — Telephone Encounter (Signed)
 Copied from CRM [id number]. Topic: Clinical - Medication Question >> Apr 17, 2024  9:50 AM Franky GRADE wrote: Reason for CRM: Patient's spouse is calling because she called the pharmacy for a refill on Semaglutide , 1 MG/DOSE, 4 MG/3ML SOPN [[id number]]; however, they wanted patient to verify if Dr.Copland was going to up the dose for the medication or leave it at the 1 MG.

## 2024-04-19 ENCOUNTER — Ambulatory Visit (HOSPITAL_COMMUNITY)
Admission: RE | Admit: 2024-04-19 | Discharge: 2024-04-19 | Disposition: A | Discharge status: Home / Self Care | Payer: Medicare Other | Source: Ambulatory Visit | Attending: Internal Medicine | Admitting: Internal Medicine

## 2024-04-19 DIAGNOSIS — I350 Nonrheumatic aortic (valve) stenosis: Secondary | ICD-10-CM | POA: Insufficient documentation

## 2024-04-19 LAB — ECHOCARDIOGRAM COMPLETE
AR max vel: 2.03 cm2
AV Area VTI: 2.17 cm2
AV Area mean vel: 2 cm2
AV Mean grad: 8 mmHg
AV Peak grad: 14.7 mmHg
Ao pk vel: 1.92 m/s
Area-P 1/2: 4.29 cm2
S' Lateral: 3.1 cm

## 2024-04-20 LAB — LIPID PANEL
Chol/HDL Ratio: 2.1 ratio (ref 0.0–5.0)
Cholesterol, Total: 114 mg/dL (ref 100–199)
HDL: 55 mg/dL (ref >39)
LDL Chol Calc (NIH): 40 mg/dL (ref 0–99)
Triglycerides: 100 mg/dL (ref 0–149)
VLDL Cholesterol Cal: 19 mg/dL (ref 5–40)

## 2024-04-20 LAB — HEPATIC FUNCTION PANEL
ALT: 18 IU/L (ref 0–44)
AST: 19 IU/L (ref 0–40)
Albumin: 4 g/dL (ref 3.8–4.8)
Alkaline Phosphatase: 89 IU/L (ref 47–123)
Bilirubin Total: 1.4 mg/dL — ABNORMAL HIGH (ref 0.0–1.2)
Bilirubin, Direct: 0.45 mg/dL — ABNORMAL HIGH (ref 0.00–0.40)
Total Protein: 6.3 g/dL (ref 6.0–8.5)

## 2024-04-24 ENCOUNTER — Encounter: Payer: Self-pay | Admitting: Family Medicine

## 2024-04-24 ENCOUNTER — Ambulatory Visit: Payer: Self-pay | Admitting: Pharmacist Clinician (PhC)/ Clinical Pharmacy Specialist

## 2024-04-24 NOTE — Progress Notes (Signed)
 [facility] [place] [address] [place], [place] [address] [phone]  Date:  04/25/2024   Name:  Nathan Byrd   DOB:  [birth year]   MRN:  [id number]  PCP:  Watt Harlene BROCKS, MD    Chief Complaint: Pre-op Exam   History of Present Illness:  Nathan Byrd is a 73 y.o. very pleasant male patient who presents with the following:  Patient seen today for a preoperative evaluation.  I last saw him in person over a year ago, we did have a virtual visit in June.  He is having a shoulder replacement actually tomorrow, I spoke with his surgical scheduler and this late request was an oversight.  He has however already been cleared by his cardiologist  See cardiology note dated 01/11/2024: Primary Cardiologist: Gordy Bergamo, MD Chart reviewed as part of pre-operative protocol coverage. Given past medical history and time since last visit, based on ACC/AHA guidelines, Nathan Byrd would be at acceptable risk for the planned procedure without further cardiovascular testing.  His RCRI is low risk, 0.9% risk of major cardiac event.  He is able to complete greater than 4 METS of physical activity. Patient was advised that if he develops new symptoms prior to surgery to contact our office to arrange a follow-up appointment.  He verbalized understanding.  Lab Results  Component Value Date   HGBA1C 5.9 02/16/2023   He notes no changes in his health since he did cardiology clearance No CP or SOB He has held his GLP1 for a week as he was directed. He would prefer to start back on Mounjaro if possible for improved weight loss  Patient Active Problem List   Diagnosis Date Noted   Essential hypertension 04/15/2019   Dyslipidemia 05/12/2016   Diet-controlled diabetes mellitus (HCC) 08/27/2014   Overweight and obesity(278.0) 05/05/2013    Past Medical History:  Diagnosis Date   Arthritis    s/p B TKR   Diabetes mellitus without  complication (HCC)    Nephrolithiasis     Past Surgical History:  Procedure Laterality Date   JOINT REPLACEMENT     s/p B TKR   ROTATOR CUFF REPAIR     Bilateral.    Social History   Tobacco Use   Smoking status: Never   Smokeless tobacco: Former    Types: Chew  Substance Use Topics   Alcohol use: Yes    Alcohol/week: 6.0 standard drinks of alcohol    Types: 6 Cans of beer per week   Drug use: No    Family History  Problem Relation Age of Onset   Alzheimer's disease Mother    Heart disease Father 47       AMI    Allergies  Allergen Reactions   Ace Inhibitors Swelling    angioedema   Amlodipine     Other Reaction(s): ANGIOEDEMA OF LIPS   Atorvastatin     Other Reaction(s): Muscle pain   Bee Venom    Lisinopril    Lovastatin      Other Reaction(s): Muscle pain   Penicillins    Rosuvastatin      Other Reaction(s): Muscle pain   Simvastatin      Other Reaction(s): Muscle pain    Medication list has been reviewed and updated.  Current Outpatient Medications on File Prior to Visit  Medication Sig Dispense Refill   aspirin 81 MG tablet Take 81 mg by mouth daily.     Evolocumab  (  REPATHA  SURECLICK) 140 MG/ML SOAJ Inject 140 mg into the skin every 14 (fourteen) days. 6 mL 3   ezetimibe -simvastatin  (VYTORIN ) 10-10 MG tablet Take 1 tablet by mouth at bedtime. 30 tablet 2   losartan (COZAAR) 100 MG tablet Take 50 mg by mouth daily.     magnesium 30 MG tablet Take 30 mg by mouth daily.     metoprolol  succinate (TOPROL  XL) 25 MG 24 hr tablet Take 1 tablet (25 mg total) by mouth at bedtime. 90 tablet 3   sertraline (ZOLOFT) 100 MG tablet Take 50 mg by mouth daily.     tadalafil  (CIALIS ) 20 MG tablet Take 0.5-1 tablets (10-20 mg total) by mouth every other day as needed for erectile dysfunction. 10 tablet 11   No current facility-administered medications on file prior to visit.    Review of Systems:  As per HPI- otherwise negative.   Physical Examination: Vitals:    04/25/24 1110  BP: 120/74  Pulse: 95  Temp: 97.6 F (36.4 C)  SpO2: 96%   Vitals:   04/25/24 1110  Weight: 237 lb 3.2 oz (107.6 kg)  Height: 5' 7 (1.702 m)   Body mass index is 37.15 kg/m. Ideal Body Weight: Weight in (lb) to have BMI = 25: 159.3  GEN: no acute distress.  Obese, looks well  HEENT: Atraumatic, Normocephalic.  Ears and Nose: No external deformity. CV: RRR, No M/G/R. No JVD. No thrill. No extra heart sounds. PULM: CTA B, no wheezes, crackles, rhonchi. No retractions. No resp. distress. No accessory muscle use. ABD: S, NT, ND EXTR: No c/c/e PSYCH: Normally interactive. Conversant.   EKG today: NSR Assessment and Plan: Preoperative clearance - Plan: EKG 12-Lead  Controlled type 2 diabetes mellitus with complication, without long-term current use of insulin (HCC) - Plan: tirzepatide (MOUNJARO) 2.5 MG/0.5ML Pen EKG normal, will fax in pre-operative clearance form He will start back with Mouajaro for DM once he is able after surgery. We will adjust dose as needed for weight loss  Signed Harlene Schroeder, MD

## 2024-04-25 ENCOUNTER — Ambulatory Visit: Admitting: Family Medicine

## 2024-04-25 ENCOUNTER — Encounter: Payer: Self-pay | Admitting: Family Medicine

## 2024-04-25 VITALS — BP 120/74 | HR 95 | Temp 97.6°F | Ht 67.0 in | Wt 237.2 lb

## 2024-04-25 DIAGNOSIS — Z01818 Encounter for other preprocedural examination: Secondary | ICD-10-CM

## 2024-04-25 DIAGNOSIS — Z7985 Long-term (current) use of injectable non-insulin antidiabetic drugs: Secondary | ICD-10-CM | POA: Diagnosis not present

## 2024-04-25 DIAGNOSIS — E118 Type 2 diabetes mellitus with unspecified complications: Secondary | ICD-10-CM

## 2024-04-25 MED ORDER — TIRZEPATIDE 2.5 MG/0.5ML ~~LOC~~ SOAJ: 2.5000 mg | SUBCUTANEOUS | 1 refills | Status: DC

## 2024-04-25 NOTE — Patient Instructions (Signed)
 Change over to Mounjaro 2.5 when you re-start your GLP after surgery. We can increase every 4 weeks or so as needed- keep me posted

## 2024-05-27 ENCOUNTER — Other Ambulatory Visit (HOSPITAL_COMMUNITY): Payer: Self-pay

## 2024-05-27 ENCOUNTER — Telehealth: Payer: Self-pay | Admitting: Pharmacy Technician

## 2024-05-27 NOTE — Telephone Encounter (Signed)
° °  Pharmacy Patient Advocate Encounter   Received notification from Onbase that prior authorization for repatha  is required/requested.   Insurance verification completed.   The patient is insured through [facility].   Per test claim: PA required; PA submitted to above mentioned insurance via Latent Key/confirmation #/EOC [id number] Status is pending

## 2024-05-27 NOTE — Telephone Encounter (Signed)
 Pharmacy Patient Advocate Encounter  Received notification from [facility] that Prior Authorization for repatha  has been APPROVED from 05/27/2024 to 06/12/25   PA #/Case ID/Reference #: [id number]

## 2024-06-14 ENCOUNTER — Telehealth: Payer: Self-pay | Admitting: *Deleted

## 2024-06-14 DIAGNOSIS — E118 Type 2 diabetes mellitus with unspecified complications: Secondary | ICD-10-CM

## 2024-06-14 MED ORDER — TIRZEPATIDE 5 MG/0.5ML ~~LOC~~ SOAJ: 5.0000 mg | SUBCUTANEOUS | 2 refills | Status: AC

## 2024-06-14 NOTE — Telephone Encounter (Signed)
 done

## 2024-07-11 ENCOUNTER — Telehealth: Payer: Self-pay | Admitting: Family Medicine

## 2024-07-11 NOTE — Telephone Encounter (Signed)
 Copied from CRM [id number]. Topic: General - Other >> Jul 11, 2024  4:11 PM Viola F wrote: Reason for CRM: Pateint needs letter submitted to [employer] regarding the reason why he is taking the Mounjaro  medication. He doesn't have the fax number but [employer]'s phone number is [phone].

## 2024-07-12 ENCOUNTER — Encounter: Payer: Self-pay | Admitting: *Deleted

## 2024-07-12 NOTE — Telephone Encounter (Signed)
 Mychart message sent to pt from PCP.

## 2024-07-12 NOTE — Progress Notes (Signed)
 PERETZ THIEME                                          MRN: [id number]   07/12/2024   The VBCI Quality Team Specialist reviewed this patient medical record for the purposes of chart review for care gap closure. The following were reviewed: chart review for care gap closure-glycemic status assessment.    VBCI Quality Team

## 2024-07-16 ENCOUNTER — Telehealth: Payer: Self-pay

## 2024-07-16 ENCOUNTER — Telehealth: Payer: Self-pay | Admitting: *Deleted

## 2024-07-16 ENCOUNTER — Other Ambulatory Visit (HOSPITAL_COMMUNITY): Payer: Self-pay

## 2024-07-16 ENCOUNTER — Other Ambulatory Visit: Payer: Self-pay | Admitting: *Deleted

## 2024-07-16 NOTE — Telephone Encounter (Signed)
 Pharmacy Patient Advocate Encounter   Received notification from Physician's Office that prior authorization for Mounjaro  5 mg/0.5 ml pen injector is required/requested.   Insurance verification completed.   The patient is insured through [employer].   Per test claim: PA required; PA submitted to above mentioned insurance via Latent Key/confirmation #/EOC [facility] Status is pending

## 2024-07-16 NOTE — Telephone Encounter (Signed)
 PA approved

## 2024-07-17 ENCOUNTER — Other Ambulatory Visit (HOSPITAL_COMMUNITY): Payer: Self-pay

## 2024-07-17 NOTE — Telephone Encounter (Signed)
 Pharmacy Patient Advocate Encounter  Received notification from [employer] that Prior Authorization for Mounjaro  5 mg/0.5 ml pen injector  has been APPROVED from 06/13/2024 to 06/12/2025. Unable to obtain price due to refill too soon rejection, last fill date 07/14/2024 next available fill date 08/04/2024   PA #/Case ID/Reference #: [id number]
